# Patient Record
Sex: Male | Born: 1965
Health system: Southern US, Community
[De-identification: ages and names within clinical notes are randomized; demographics above are authoritative.]

## PROBLEM LIST (undated history)

## (undated) DIAGNOSIS — K219 Gastro-esophageal reflux disease without esophagitis: Secondary | ICD-10-CM

## (undated) DIAGNOSIS — T7840XA Allergy, unspecified, initial encounter: Secondary | ICD-10-CM

## (undated) DIAGNOSIS — H8109 Meniere's disease, unspecified ear: Secondary | ICD-10-CM

## (undated) HISTORY — PX: OTHER SURGICAL HISTORY: SHX169

## (undated) HISTORY — DX: Gastro-esophageal reflux disease without esophagitis: K21.9

## (undated) HISTORY — PX: HERNIA REPAIR: SHX51

## (undated) HISTORY — PX: KNEE SURGERY: SHX244

## (undated) HISTORY — PX: UVULECTOMY: SHX2631

## (undated) HISTORY — DX: Allergy, unspecified, initial encounter: T78.40XA

## (undated) HISTORY — DX: Meniere's disease, unspecified ear: H81.09

---

## 2004-10-13 ENCOUNTER — Encounter: Admission: RE | Admit: 2004-10-13 | Discharge: 2004-12-09 | Payer: Self-pay | Admitting: Orthopedic Surgery

## 2006-10-03 ENCOUNTER — Emergency Department (HOSPITAL_COMMUNITY): Admission: EM | Admit: 2006-10-03 | Discharge: 2006-10-03 | Payer: Self-pay | Admitting: Emergency Medicine

## 2008-10-05 ENCOUNTER — Ambulatory Visit: Payer: Self-pay | Admitting: Gastroenterology

## 2008-10-05 DIAGNOSIS — K3189 Other diseases of stomach and duodenum: Secondary | ICD-10-CM

## 2008-10-05 DIAGNOSIS — R1013 Epigastric pain: Secondary | ICD-10-CM | POA: Insufficient documentation

## 2008-10-05 DIAGNOSIS — R197 Diarrhea, unspecified: Secondary | ICD-10-CM | POA: Insufficient documentation

## 2008-10-05 LAB — CONVERTED CEMR LAB
ALT: 31 units/L (ref 0–53)
AST: 23 units/L (ref 0–37)
Albumin: 4.5 g/dL (ref 3.5–5.2)
Alkaline Phosphatase: 72 units/L (ref 39–117)
BUN: 10 mg/dL (ref 6–23)
Basophils Relative: 0.7 % (ref 0.0–3.0)
Calcium: 9.3 mg/dL (ref 8.4–10.5)
Chloride: 105 meq/L (ref 96–112)
Eosinophils Absolute: 0.3 10*3/uL (ref 0.0–0.7)
Lymphocytes Relative: 21.1 % (ref 12.0–46.0)
MCHC: 35.8 g/dL (ref 30.0–36.0)
Neutrophils Relative %: 62.1 % (ref 43.0–77.0)
Platelets: 214 10*3/uL (ref 150.0–400.0)
Potassium: 4.6 meq/L (ref 3.5–5.1)
RBC: 4.57 M/uL (ref 4.22–5.81)
Sodium: 141 meq/L (ref 135–145)
Tissue Transglutaminase Ab, IgA: 0.4 units (ref <7)
Total Protein: 7.2 g/dL (ref 6.0–8.3)
WBC: 6.4 10*3/uL (ref 4.5–10.5)

## 2009-08-09 ENCOUNTER — Encounter: Admission: RE | Admit: 2009-08-09 | Discharge: 2009-08-09 | Payer: Self-pay | Admitting: General Surgery

## 2009-10-25 ENCOUNTER — Inpatient Hospital Stay (HOSPITAL_COMMUNITY): Admission: RE | Admit: 2009-10-25 | Discharge: 2009-10-29 | Payer: Self-pay | Admitting: General Surgery

## 2009-12-13 ENCOUNTER — Encounter: Admission: RE | Admit: 2009-12-13 | Discharge: 2009-12-13 | Payer: Self-pay | Admitting: General Surgery

## 2010-03-22 ENCOUNTER — Encounter: Payer: Self-pay | Admitting: General Surgery

## 2010-05-16 LAB — DIFFERENTIAL
Basophils Absolute: 0 10*3/uL (ref 0.0–0.1)
Eosinophils Absolute: 0.1 10*3/uL (ref 0.0–0.7)
Eosinophils Relative: 2 % (ref 0–5)
Lymphocytes Relative: 27 % (ref 12–46)
Monocytes Absolute: 0.5 10*3/uL (ref 0.1–1.0)

## 2010-05-16 LAB — BASIC METABOLIC PANEL
BUN: 10 mg/dL (ref 6–23)
BUN: 10 mg/dL (ref 6–23)
CO2: 28 mEq/L (ref 19–32)
CO2: 30 mEq/L (ref 19–32)
Calcium: 8.5 mg/dL (ref 8.4–10.5)
Chloride: 103 mEq/L (ref 96–112)
Creatinine, Ser: 0.97 mg/dL (ref 0.4–1.5)
GFR calc non Af Amer: >60 mL/min (ref >60)
Glucose, Bld: 123 mg/dL — ABNORMAL HIGH (ref 70–99)
Glucose, Bld: 87 mg/dL (ref 70–99)
Potassium: 3.8 mEq/L (ref 3.5–5.1)
Potassium: 4.2 mEq/L (ref 3.5–5.1)
Sodium: 133 mEq/L — ABNORMAL LOW (ref 135–145)

## 2010-05-16 LAB — URINALYSIS, ROUTINE W REFLEX MICROSCOPIC
Glucose, UA: NEGATIVE mg/dL
Hgb urine dipstick: NEGATIVE
Ketones, ur: NEGATIVE mg/dL
Protein, ur: NEGATIVE mg/dL
Urobilinogen, UA: 1 mg/dL (ref 0.0–1.0)

## 2010-05-16 LAB — CBC
HCT: 34 % — ABNORMAL LOW (ref 39.0–52.0)
HCT: 40.7 % (ref 39.0–52.0)
HCT: 41.8 % (ref 39.0–52.0)
Hemoglobin: 11.3 g/dL — ABNORMAL LOW (ref 13.0–17.0)
Hemoglobin: 13.7 g/dL (ref 13.0–17.0)
MCH: 30.8 pg (ref 26.0–34.0)
MCH: 31.2 pg (ref 26.0–34.0)
MCH: 31.4 pg (ref 26.0–34.0)
MCHC: 33.2 g/dL (ref 30.0–36.0)
MCHC: 33.7 g/dL (ref 30.0–36.0)
MCHC: 34.4 g/dL (ref 30.0–36.0)
MCV: 90.5 fL (ref 78.0–100.0)
MCV: 92.6 fL (ref 78.0–100.0)
Platelets: 205 10*3/uL (ref 150–400)
RBC: 3.67 MIL/uL — ABNORMAL LOW (ref 4.22–5.81)
RDW: 12.4 % (ref 11.5–15.5)
RDW: 12.6 % (ref 11.5–15.5)

## 2010-05-16 LAB — SURGICAL PCR SCREEN: Staphylococcus aureus: NEGATIVE

## 2010-11-20 DIAGNOSIS — J309 Allergic rhinitis, unspecified: Secondary | ICD-10-CM | POA: Insufficient documentation

## 2010-12-15 LAB — CBC
HCT: 42.6
MCHC: 34
MCV: 91.2
Platelets: 252
RDW: 12.5
WBC: 8.5

## 2010-12-15 LAB — POCT URINALYSIS DIP (DEVICE)
Hgb urine dipstick: NEGATIVE
Ketones, ur: NEGATIVE
Specific Gravity, Urine: 1.02
Urobilinogen, UA: 1
pH: 7

## 2010-12-15 LAB — DIFFERENTIAL
Basophils Relative: 0
Eosinophils Absolute: 0
Eosinophils Relative: 1
Lymphs Abs: 1.2

## 2010-12-15 LAB — I-STAT 8, (EC8 V) (CONVERTED LAB)
Acid-Base Excess: 5 — ABNORMAL HIGH
Bicarbonate: 29.6 — ABNORMAL HIGH
Chloride: 100
pCO2, Ven: 44.4 — ABNORMAL LOW
pH, Ven: 7.432 — ABNORMAL HIGH

## 2010-12-15 LAB — POCT I-STAT CREATININE
Creatinine, Ser: 1.2
Operator id: 235561

## 2010-12-15 LAB — D-DIMER, QUANTITATIVE: D-Dimer, Quant: <0.22

## 2011-11-25 ENCOUNTER — Other Ambulatory Visit: Payer: Self-pay | Admitting: Neurology

## 2011-11-25 DIAGNOSIS — R519 Headache, unspecified: Secondary | ICD-10-CM

## 2011-11-27 ENCOUNTER — Ambulatory Visit
Admission: RE | Admit: 2011-11-27 | Discharge: 2011-11-27 | Disposition: A | Discharge status: Home / Self Care | Payer: BC Managed Care – PPO | Source: Ambulatory Visit | Attending: Neurology | Admitting: Neurology

## 2011-11-27 DIAGNOSIS — R519 Headache, unspecified: Secondary | ICD-10-CM

## 2012-07-11 ENCOUNTER — Encounter (HOSPITAL_BASED_OUTPATIENT_CLINIC_OR_DEPARTMENT_OTHER): Admission: RE | Payer: Self-pay | Source: Ambulatory Visit

## 2012-07-11 ENCOUNTER — Ambulatory Visit (HOSPITAL_BASED_OUTPATIENT_CLINIC_OR_DEPARTMENT_OTHER)
Admission: RE | Admit: 2012-07-11 | Payer: BC Managed Care – PPO | Source: Ambulatory Visit | Admitting: Orthopedic Surgery

## 2012-07-11 SURGERY — EXCISION, GANGLION CYST, WRIST
Anesthesia: General | Site: Wrist | Laterality: Right

## 2012-08-04 DIAGNOSIS — F988 Other specified behavioral and emotional disorders with onset usually occurring in childhood and adolescence: Secondary | ICD-10-CM | POA: Insufficient documentation

## 2012-09-18 ENCOUNTER — Emergency Department (HOSPITAL_COMMUNITY)
Admission: EM | Admit: 2012-09-18 | Discharge: 2012-09-18 | Disposition: A | Discharge status: Home / Self Care | Payer: BC Managed Care – PPO | Attending: Emergency Medicine | Admitting: Emergency Medicine

## 2012-09-18 DIAGNOSIS — M26609 Unspecified temporomandibular joint disorder, unspecified side: Secondary | ICD-10-CM | POA: Insufficient documentation

## 2012-09-18 DIAGNOSIS — H9190 Unspecified hearing loss, unspecified ear: Secondary | ICD-10-CM | POA: Insufficient documentation

## 2012-09-18 DIAGNOSIS — H9312 Tinnitus, left ear: Secondary | ICD-10-CM

## 2012-09-18 DIAGNOSIS — Z79899 Other long term (current) drug therapy: Secondary | ICD-10-CM | POA: Insufficient documentation

## 2012-09-18 DIAGNOSIS — H6980 Other specified disorders of Eustachian tube, unspecified ear: Secondary | ICD-10-CM | POA: Insufficient documentation

## 2012-09-18 DIAGNOSIS — H6990 Unspecified Eustachian tube disorder, unspecified ear: Secondary | ICD-10-CM | POA: Insufficient documentation

## 2012-09-18 DIAGNOSIS — H9202 Otalgia, left ear: Secondary | ICD-10-CM

## 2012-09-18 DIAGNOSIS — H9209 Otalgia, unspecified ear: Secondary | ICD-10-CM | POA: Insufficient documentation

## 2012-09-18 DIAGNOSIS — H9319 Tinnitus, unspecified ear: Secondary | ICD-10-CM | POA: Insufficient documentation

## 2012-09-18 MED ORDER — HYDROCODONE-ACETAMINOPHEN 5-325 MG PO TABS: 1.0000 | ORAL_TABLET | ORAL | Status: DC | PRN

## 2012-09-18 MED ORDER — ANTIPYRINE-BENZOCAINE 5.4-1.4 % OT SOLN
3.0000 [drp] | OTIC | Status: AC | PRN
Start: 2012-09-18 — End: 2012-09-18
  Administered 2012-09-18: 3 [drp] via OTIC
  Filled 2012-09-18: qty 10

## 2012-09-18 MED ORDER — OXYCODONE-ACETAMINOPHEN 5-325 MG PO TABS
1.0000 | ORAL_TABLET | Freq: Once | ORAL | Status: AC
  Administered 2012-09-18: 1 via ORAL
  Filled 2012-09-18: qty 1

## 2012-09-18 NOTE — ED Notes (Signed)
Pt has been having pain to his left ear for the past month.  Pt was seen by PCP and referred to ENT, pt saw ENT 2 weeks ago and was given Prednisone and "something for inflammation".  Pt states these medication did not help with the pain.  No drainage noted from ear- pt denies any other symptoms.

## 2012-09-18 NOTE — ED Provider Notes (Signed)
History  This chart was scribed for Sharilyn Sites, PA-C working with Candyce Churn, MD by Greggory Stallion, ED scribe. This patient was seen in room TR05C/TR05C and the patient's care was started at 7:29 PM.  CSN: [id number] Arrival date & time 09/18/12  1918   Chief Complaint  Patient presents with  . Otalgia   The history is provided by the patient. No language interpreter was used.    HPI Comments: Roger Martin is a 47 y.o. male who presents to the Emergency Department complaining of gradually worsening, constant left otalgia, tinnitus, that started several months ago. Pain worse with laughing, or opening his mouth wide and feels like a throbbing sensation deep in his ear.  No recent injury or trauma.  Has seen 3 different ENT physicians Annalee Genta, [place], [facility]) and was dx with sensorineural hearing loss, tinnitus, eustachian tube dysfunction, and TMJ by all 3.  He has been taking prednisone and ibuprofen given to him by ENT without relief.  Pt has also seen dentist for TMJ and is being fitting for a mouth guard.  No recent ear infections, fevers, sweats, or chills.  Pt has an appt with new ENT tomorrow.  No difficulty swallowing.  Pt brought in medical records from ENT which i have reviewed.  CT of sinuses completed Sept 2013 which was normal.   No past medical history on file. No past surgical history on file. No family history on file. History  Substance Use Topics  . Smoking status: Not on file  . Smokeless tobacco: Not on file  . Alcohol Use: Not on file    Review of Systems  HENT: Positive for hearing loss, ear pain and tinnitus.   All other systems reviewed and are negative.    Allergies  Review of patient's allergies indicates not on file.  Home Medications  No current outpatient prescriptions on file.  BP 136/97  Pulse 91  Temp(Src) 97.8 F (36.6 C)  Resp 17  Ht 5\' 9"  (1.753 m)  Wt 196 lb (88.905 kg)  BMI 28.93 kg/m2  SpO2 96%  Physical Exam   Nursing note and vitals reviewed. Constitutional: He is oriented to person, place, and time. He appears well-developed and well-nourished.  HENT:  Head: Normocephalic and atraumatic.  Left Ear: Tympanic membrane, external ear and ear canal normal. No drainage, swelling or tenderness. No foreign bodies. No mastoid tenderness. Tympanic membrane is not injected, not scarred, not erythematous and not bulging.  No middle ear effusion. No hemotympanum. Decreased hearing (muffled sounds) is noted.  Mouth/Throat: Oropharynx is clear and moist.  Decreased hearing of left ear (muffled sounds), TM normal, no mastoid tenderness, no middle ear effusion or evidence of OM/OE  Eyes: Conjunctivae and EOM are normal. Pupils are equal, round, and reactive to light.  Neck: Normal range of motion. Neck supple.  Cardiovascular: Normal rate, regular rhythm and normal heart sounds.   Pulmonary/Chest: Effort normal and breath sounds normal.  Musculoskeletal: Normal range of motion.  Neurological: He is alert and oriented to person, place, and time.  Skin: Skin is warm and dry.  Psychiatric: He has a normal mood and affect.    ED Course  Procedures (including critical care time)  DIAGNOSTIC STUDIES: Oxygen Saturation is 96% on RA, normal by my interpretation.    COORDINATION OF CARE: 7:49 PM-Discussed treatment plan which includes pain medication with pt at bedside and pt agreed to plan.   Labs Reviewed - No data to display No results found.  1.  Otalgia of left ear   2. Tinnitus, left     MDM   Chronic ear problems, currently managed by ENT.  Auralgan and percocet given in the ED.  Do not feel that CT is indicated at this time as sx have not changed, there is no mastoid tenderness, or evidence of indwelling infection.  Rx vicodin.  FU with ENT tomorrow as previously scheduled.  Discussed plan with pt, he agreed.  Return precautions advised.  I personally performed the services described in this  documentation, which was scribed in my presence. The recorded information has been reviewed and is accurate.   Garlon Hatchet, PA-C 09/18/12 2037

## 2012-09-21 NOTE — ED Provider Notes (Signed)
Medical screening examination/treatment/procedure(s) were performed by non-physician practitioner and as supervising physician I was immediately available for consultation/collaboration.  Candyce Churn, MD 09/21/12 1101

## 2013-03-29 DIAGNOSIS — H8102 Meniere's disease, left ear: Secondary | ICD-10-CM | POA: Insufficient documentation

## 2014-10-30 DIAGNOSIS — H918X2 Other specified hearing loss, left ear: Secondary | ICD-10-CM | POA: Insufficient documentation

## 2015-05-28 DIAGNOSIS — E038 Other specified hypothyroidism: Secondary | ICD-10-CM | POA: Insufficient documentation

## 2016-03-12 DIAGNOSIS — K645 Perianal venous thrombosis: Secondary | ICD-10-CM | POA: Insufficient documentation

## 2016-10-16 DIAGNOSIS — J301 Allergic rhinitis due to pollen: Secondary | ICD-10-CM | POA: Diagnosis not present

## 2016-10-19 DIAGNOSIS — J3089 Other allergic rhinitis: Secondary | ICD-10-CM | POA: Diagnosis not present

## 2017-06-07 DIAGNOSIS — G510 Bell's palsy: Secondary | ICD-10-CM | POA: Diagnosis not present

## 2017-06-07 DIAGNOSIS — K119 Disease of salivary gland, unspecified: Secondary | ICD-10-CM | POA: Diagnosis not present

## 2017-06-07 DIAGNOSIS — R221 Localized swelling, mass and lump, neck: Secondary | ICD-10-CM | POA: Diagnosis not present

## 2017-06-07 DIAGNOSIS — R200 Anesthesia of skin: Secondary | ICD-10-CM | POA: Diagnosis not present

## 2017-06-10 DIAGNOSIS — K118 Other diseases of salivary glands: Secondary | ICD-10-CM | POA: Insufficient documentation

## 2017-06-10 DIAGNOSIS — G510 Bell's palsy: Secondary | ICD-10-CM | POA: Diagnosis not present

## 2017-06-25 DIAGNOSIS — J301 Allergic rhinitis due to pollen: Secondary | ICD-10-CM | POA: Diagnosis not present

## 2017-06-28 DIAGNOSIS — J3089 Other allergic rhinitis: Secondary | ICD-10-CM | POA: Diagnosis not present

## 2017-09-22 DIAGNOSIS — B9789 Other viral agents as the cause of diseases classified elsewhere: Secondary | ICD-10-CM | POA: Diagnosis not present

## 2017-09-22 DIAGNOSIS — J069 Acute upper respiratory infection, unspecified: Secondary | ICD-10-CM | POA: Diagnosis not present

## 2017-10-04 DIAGNOSIS — N529 Male erectile dysfunction, unspecified: Secondary | ICD-10-CM | POA: Diagnosis not present

## 2017-10-04 DIAGNOSIS — N50819 Testicular pain, unspecified: Secondary | ICD-10-CM | POA: Diagnosis not present

## 2018-03-17 DIAGNOSIS — J3081 Allergic rhinitis due to animal (cat) (dog) hair and dander: Secondary | ICD-10-CM | POA: Diagnosis not present

## 2018-03-17 DIAGNOSIS — H1045 Other chronic allergic conjunctivitis: Secondary | ICD-10-CM | POA: Diagnosis not present

## 2018-03-17 DIAGNOSIS — J301 Allergic rhinitis due to pollen: Secondary | ICD-10-CM | POA: Diagnosis not present

## 2018-03-17 DIAGNOSIS — J3089 Other allergic rhinitis: Secondary | ICD-10-CM | POA: Diagnosis not present

## 2018-03-30 DIAGNOSIS — J301 Allergic rhinitis due to pollen: Secondary | ICD-10-CM | POA: Diagnosis not present

## 2018-03-31 DIAGNOSIS — J3089 Other allergic rhinitis: Secondary | ICD-10-CM | POA: Diagnosis not present

## 2018-06-20 ENCOUNTER — Ambulatory Visit: Payer: Self-pay | Admitting: Adult Health

## 2018-08-01 DIAGNOSIS — J3089 Other allergic rhinitis: Secondary | ICD-10-CM | POA: Diagnosis not present

## 2018-08-01 DIAGNOSIS — J301 Allergic rhinitis due to pollen: Secondary | ICD-10-CM | POA: Diagnosis not present

## 2018-08-08 ENCOUNTER — Ambulatory Visit: Payer: BC Managed Care – PPO | Admitting: Adult Health

## 2018-08-08 NOTE — Progress Notes (Deleted)
   Subjective:    Patient ID: Roger Martin, male    DOB: 07/04/1965, 53 y.o.   MRN: [id number]  HPI:  Mr. Zaino is here to establish as a new pt.  He is a pleasant 53 year old male. PMH: Depression   Patient Care Team    Relationship Specialty Notifications Start End  Vickii Penna, MD PCP - General Family Medicine  07/04/12     Patient Active Problem List   Diagnosis Date Noted  . DYSPEPSIA 10/05/2008  . DIARRHEA 10/05/2008     No past medical history on file.   *** The histories are not reviewed yet. Please review them in the "History" navigator section and refresh this [place].   No family history on file.   Social History   Substance and Sexual Activity  Drug Use Not on file     Social History   Substance and Sexual Activity  Alcohol Use Not on file     Social History   Tobacco Use  Smoking Status Not on file     Outpatient Encounter Medications as of 08/08/2018  Medication Sig  . Ascorbic Acid (VITAMIN C PO) Take 1 tablet by mouth daily.  Marland Kitchen atomoxetine (STRATTERA) 10 MG capsule Take 10 mg by mouth daily.  Marland Kitchen etodolac (LODINE) 400 MG tablet Take 400 mg by mouth 2 (two) times daily.  . fexofenadine (ALLEGRA) 180 MG tablet Take 180 mg by mouth daily.  Marland Kitchen HYDROcodone-acetaminophen (NORCO/VICODIN) 5-325 MG per tablet Take 1 tablet by mouth every 4 (four) hours as needed for pain.  Marland Kitchen ibuprofen (ADVIL,MOTRIN) 200 MG tablet Take 800 mg by mouth every 6 (six) hours as needed for pain or headache.  . montelukast (SINGULAIR) 10 MG tablet Take 10 mg by mouth daily.  . Multiple Vitamins-Minerals (ZINC PO) Take 1 tablet by mouth daily.  . Oxymetazoline HCl (NASAL SPRAY NA) Place 1 spray into the nose daily.  . predniSONE (DELTASONE) 20 MG tablet Take 20 mg by mouth daily.   No facility-administered encounter medications on file as of 08/08/2018.     Allergies: Patient has no allergy information on record.  There is no height or weight on file to calculate  BMI.  There were no vitals taken for this visit.     Review of Systems     Objective:   Physical Exam        Assessment & Plan:  No diagnosis found.  No problem-specific Assessment & Plan notes found for this encounter.    FOLLOW-UP:  No follow-ups on file.

## 2018-08-10 DIAGNOSIS — H00022 Hordeolum internum right lower eyelid: Secondary | ICD-10-CM | POA: Diagnosis not present

## 2018-08-10 DIAGNOSIS — H11043 Peripheral pterygium, stationary, bilateral: Secondary | ICD-10-CM | POA: Diagnosis not present

## 2018-08-11 ENCOUNTER — Ambulatory Visit: Payer: BC Managed Care – PPO | Admitting: Adult Health

## 2018-08-11 ENCOUNTER — Encounter: Payer: Self-pay | Admitting: Adult Health

## 2018-08-11 ENCOUNTER — Other Ambulatory Visit: Payer: Self-pay

## 2018-08-11 VITALS — BP 120/76 | HR 60 | Temp 98.2°F | Ht 68.25 in | Wt 195.7 lb

## 2018-08-11 DIAGNOSIS — Z1211 Encounter for screening for malignant neoplasm of colon: Secondary | ICD-10-CM

## 2018-08-11 DIAGNOSIS — Z0000 Encounter for general adult medical examination without abnormal findings: Secondary | ICD-10-CM | POA: Insufficient documentation

## 2018-08-11 DIAGNOSIS — S36119A Unspecified injury of liver, initial encounter: Secondary | ICD-10-CM | POA: Insufficient documentation

## 2018-08-11 DIAGNOSIS — M5432 Sciatica, left side: Secondary | ICD-10-CM

## 2018-08-11 DIAGNOSIS — H00022 Hordeolum internum right lower eyelid: Secondary | ICD-10-CM | POA: Diagnosis not present

## 2018-08-11 DIAGNOSIS — S36119S Unspecified injury of liver, sequela: Secondary | ICD-10-CM

## 2018-08-11 MED ORDER — OMEPRAZOLE 20 MG PO CPDR: 20.0000 mg | DELAYED_RELEASE_CAPSULE | Freq: Every day | ORAL | 2 refills | Status: DC

## 2018-08-11 NOTE — Assessment & Plan Note (Signed)
Omeprazole 20mg QD 
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
## 2018-08-11 NOTE — Patient Instructions (Signed)
Heartburn Heartburn is a type of pain or discomfort that can happen in the throat or chest. It is often described as a burning pain. It may also cause a bad, acid-like taste in the mouth. Heartburn may feel worse when you lie down or bend over. It may be worse at night. It may be caused by stomach contents that move back up (reflux) into the tube that connects the mouth with the stomach (esophagus). Follow these instructions at home: Eating and drinking   Avoid certain foods and drinks as told by your doctor. This may include: ? Coffee and tea (with or without caffeine). ? Drinks that have alcohol. ? Energy drinks and sports drinks. ? Carbonated drinks or sodas. ? Chocolate and cocoa. ? Peppermint and mint flavorings. ? Garlic and onions. ? Horseradish. ? Spicy and acidic foods, such as:  Peppers.  Chili powder and curry powder.  Vinegar.  Hot sauces and BBQ sauce. ? Citrus fruit juices and citrus fruits, such as:  Oranges.  Lemons.  Limes. ? Tomato-based foods, such as:  Red sauce and pizza with red sauce.  Chili.  Salsa. ? Fried and fatty foods, such as:  Donuts.  French fries and potato chips.  High-fat dressings. ? High-fat meats, such as:  Hot dogs and sausage.  Rib eye steak.  Ham and bacon. ? High-fat dairy items, such as:  Whole milk.  Butter.  Cream cheese.  Eat small meals often. Avoid eating large meals.  Avoid drinking large amounts of liquid with your meals.  Avoid eating meals during the 2-3 hours before bedtime.  Avoid lying down right after you eat.  Do not exercise right after you eat. Lifestyle      If you are overweight, lose an amount of weight that is healthy for you. Ask your doctor about a safe weight loss goal.  Do not use any products that contain nicotine or tobacco, including cigarettes, e-cigarettes, and chewing tobacco. These can make your symptoms worse. If you need help quitting, ask your doctor.  Wear loose  clothes. Do not wear anything tight around your waist.  Raise (elevate) the head of your bed about 6 inches (15 cm) when you sleep.  Try to lower your stress. If you need help doing this, ask your doctor. General instructions  Pay attention to any changes in your symptoms.  Take over-the-counter and prescription medicines only as told by your doctor. ? Do not take aspirin, ibuprofen, or other NSAIDs unless your doctor says it is okay. ? Stop medicines only as told by your doctor.  Keep all follow-up visits as told by your doctor. This is important. Contact a doctor if:  You have new symptoms.  You lose weight and you do not know why it is happening.  You have trouble swallowing, or it hurts to swallow.  You have wheezing or a cough that keeps happening.  Your symptoms do not get better with treatment.  You have heartburn often for more than 2 weeks. Get help right away if:  You have pain in your arms, neck, jaw, teeth, or back.  You feel sweaty, dizzy, or light-headed.  You have chest pain or shortness of breath.  You throw up (vomit) and your throw up looks like blood or coffee grounds.  Your poop (stool) is bloody or black. These symptoms may represent a serious problem that is an emergency. Do not wait to see if the symptoms will go away. Get medical help right away. Call your local   emergency services (911 in the [place] Do not drive yourself to the hospital. Summary  Heartburn is a type of pain that can happen in the throat or chest. It can feel like a burning pain. It may also cause a bad, acid-like taste in the mouth.  You may need to avoid certain foods and drinks to help your symptoms. Ask your doctor what foods and drinks you should avoid.  Take over-the-counter and prescription medicines only as told by your doctor. Do not take aspirin, ibuprofen, or other NSAIDs unless your doctor told you to do so.  Contact your doctor if your symptoms do not get better or  they get worse. This information is not intended to replace advice given to you by your health care provider. Make sure you discuss any questions you have with your health care provider. Document Released: 10/29/2010 Document Revised: 07/19/2017 Document Reviewed: 07/19/2017 Elsevier Interactive Patient Education  2019 [employer] well hydrated, follow Heart Healthy Diet. Continue your excellent exercise program. Referral to Physical Therapy placed, re: Left sided sciatica. Omeprazole prescription sent to [facility] on [address]. Please schedule complete physical this summer. Continue to social distance and wear a mask when out in public. WELCOME TO THE PRACTICE!

## 2018-08-11 NOTE — Assessment & Plan Note (Signed)
Remain well hydrated, follow Heart Healthy Diet. Continue your excellent exercise program. Referral to Physical Therapy placed, re: Left sided sciatica. Omeprazole prescription sent to [facility] on [address]. Please schedule complete physical this summer. Continue to social distance and wear a mask when out in public.

## 2018-08-12 DIAGNOSIS — M5432 Sciatica, left side: Secondary | ICD-10-CM | POA: Insufficient documentation

## 2018-08-12 NOTE — Assessment & Plan Note (Signed)
>  20 years His normal stretching routine has not relieved the pain, which it typically does PT referral placed.
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
## 2018-08-22 ENCOUNTER — Other Ambulatory Visit: Payer: Self-pay

## 2018-08-22 ENCOUNTER — Ambulatory Visit: Payer: BC Managed Care – PPO | Attending: Adult Health | Admitting: Physical Therapy

## 2018-08-22 DIAGNOSIS — G8929 Other chronic pain: Secondary | ICD-10-CM | POA: Insufficient documentation

## 2018-08-22 DIAGNOSIS — M5442 Lumbago with sciatica, left side: Secondary | ICD-10-CM | POA: Insufficient documentation
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
## 2018-08-22 NOTE — Patient Instructions (Signed)
Access Code: [email or web address]  URL: [email or web address]  Date: 08/22/2018  Prepared by: Elsie Ra   Exercises  Lying Prone - 1 reps - 1 sets - 3 to 5 min hold - 2x daily - 6x weekly  Supine Piriformis Stretch - 3 reps - 1 sets - 30 hold - 2x daily - 6x weekly  Supine Hamstring Stretch with Strap - 3 reps - 1 sets - 30 hold - 2x daily - 6x weekly  Supine Bridge - 10 reps - 1-2 sets - 5 hold - 2x daily - 6x weekly  Bird Dog - 10 reps - 1 sets - 5 sec hold - 2x daily - 6x weekly  Standing Quadratus Lumborum Stretch with Doorway - 3 reps - 1 sets - 30 hold - 2x daily - 6x weekly

## 2018-08-30 ENCOUNTER — Ambulatory Visit: Payer: BC Managed Care – PPO | Admitting: Physical Therapy

## 2018-09-05 ENCOUNTER — Encounter: Payer: Self-pay | Admitting: Physical Therapy

## 2018-09-05 ENCOUNTER — Ambulatory Visit: Payer: BC Managed Care – PPO | Attending: Adult Health | Admitting: Physical Therapy

## 2018-09-05 ENCOUNTER — Other Ambulatory Visit: Payer: Self-pay

## 2018-09-05 DIAGNOSIS — G8929 Other chronic pain: Secondary | ICD-10-CM

## 2018-09-05 DIAGNOSIS — M5442 Lumbago with sciatica, left side: Secondary | ICD-10-CM | POA: Diagnosis not present
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
## 2018-09-06 ENCOUNTER — Encounter: Payer: Self-pay | Admitting: Gastroenterology

## 2018-09-12 ENCOUNTER — Ambulatory Visit: Payer: BC Managed Care – PPO | Admitting: Physical Therapy

## 2018-09-19 ENCOUNTER — Ambulatory Visit: Payer: BC Managed Care – PPO | Admitting: Physical Therapy

## 2018-09-23 ENCOUNTER — Other Ambulatory Visit: Payer: Self-pay

## 2018-09-23 ENCOUNTER — Ambulatory Visit (AMBULATORY_SURGERY_CENTER): Payer: Self-pay

## 2018-09-23 VITALS — Ht 69.0 in | Wt 194.0 lb

## 2018-09-23 DIAGNOSIS — Z1211 Encounter for screening for malignant neoplasm of colon: Secondary | ICD-10-CM

## 2018-09-23 MED ORDER — PEG 3350-KCL-NA BICARB-NACL 420 G PO SOLR: 4000.0000 mL | Freq: Once | ORAL | 0 refills | Status: AC

## 2018-09-23 NOTE — Progress Notes (Signed)
Denies allergies to eggs or soy products. Denies complication of anesthesia or sedation. Denies use of weight loss medication. Denies use of O2.   Emmi instructions given for colonoscopy.   Pre-Visit was conducted by phone due to Covid 19. Instructions were reviewed and mailed to patients confirmed home address. Patient was encouraged to call if they had any questions regarding instructions.

## 2018-09-27 ENCOUNTER — Ambulatory Visit: Payer: BC Managed Care – PPO | Admitting: Physical Therapy

## 2018-09-27 ENCOUNTER — Telehealth: Payer: Self-pay | Admitting: Physical Therapy

## 2018-09-27 NOTE — Telephone Encounter (Signed)
Pt no show for PT appointment today. They where contacted and informed of this via voicemail. They where given number to call front office to reschedule as he does not have any more appointments after today.  Elsie Ra, PT, DPT 09/27/18 5:22 PM

## 2018-10-06 ENCOUNTER — Telehealth: Payer: Self-pay | Admitting: Gastroenterology

## 2018-10-06 NOTE — Telephone Encounter (Signed)

## 2018-10-06 NOTE — Telephone Encounter (Signed)
Pt responded "no" to all screening questions °

## 2018-10-07 ENCOUNTER — Encounter: Payer: Self-pay | Admitting: Gastroenterology

## 2018-10-07 ENCOUNTER — Ambulatory Visit (AMBULATORY_SURGERY_CENTER): Payer: BC Managed Care – PPO | Admitting: Gastroenterology

## 2018-10-07 ENCOUNTER — Other Ambulatory Visit: Payer: Self-pay

## 2018-10-07 VITALS — BP 117/80 | HR 65 | Temp 97.7°F | Resp 15 | Ht 68.25 in | Wt 195.0 lb

## 2018-10-07 DIAGNOSIS — Z1211 Encounter for screening for malignant neoplasm of colon: Secondary | ICD-10-CM | POA: Diagnosis not present

## 2018-10-07 MED ORDER — SODIUM CHLORIDE 0.9 % IV SOLN: 500.0000 mL | Freq: Once | INTRAVENOUS | Status: DC

## 2018-10-07 NOTE — Op Note (Signed)
[place] Patient Name: Roger Martin Procedure Date: 10/07/2018 7:58 AM MRN: [id number] Endoscopist: Milus Banister , MD Age: 53 Referring MD:  Date of Birth: 1965-10-10 Gender: Male Account #: [id number] Procedure:                Colonoscopy Indications:              Screening for colorectal malignant neoplasm Medicines:                Monitored Anesthesia Care Procedure:                Pre-Anesthesia Assessment:                           - Prior to the procedure, a History and Physical                            was performed, and patient medications and                            allergies were reviewed. The patient's tolerance of                            previous anesthesia was also reviewed. The risks                            and benefits of the procedure and the sedation                            options and risks were discussed with the patient.                            All questions were answered, and informed consent                            was obtained. Prior Anticoagulants: The patient has                            taken no previous anticoagulant or antiplatelet                            agents. ASA Grade Assessment: II - A patient with                            mild systemic disease. After reviewing the risks                            and benefits, the patient was deemed in                            satisfactory condition to undergo the procedure.                           After obtaining informed consent, the colonoscope  was passed under direct vision. Throughout the                            procedure, the patient's blood pressure, pulse, and                            oxygen saturations were monitored continuously. The                            Colonoscope was introduced through the anus and                            advanced to the the cecum, identified by                            appendiceal orifice and  ileocecal valve. The                            colonoscopy was performed without difficulty. The                            patient tolerated the procedure well. The quality                            of the bowel preparation was good. The ileocecal                            valve, appendiceal orifice, and rectum were                            photographed. Scope In: 8:04:06 AM Scope Out: 8:16:32 AM Scope Withdrawal Time: 0 hours 10 minutes 15 seconds  Total Procedure Duration: 0 hours 12 minutes 26 seconds  Findings:                 The entire examined colon appeared normal on direct                            and retroflexion views. Complications:            No immediate complications. Estimated blood loss:                            None. Estimated Blood Loss:     Estimated blood loss: none. Impression:               - The entire examined colon is normal on direct and                            retroflexion views.                           - No polyps or cancers. Recommendation:           - Patient has a contact number available for  emergencies. The signs and symptoms of potential                            delayed complications were discussed with the                            patient. Return to normal activities tomorrow.                            Written discharge instructions were provided to the                            patient.                           - Resume previous diet.                           - Continue present medications.                           - Repeat colonoscopy in 10 years for screening. Rachael Feeaniel P Jacobs, MD 10/07/2018 8:20:50 AM This report has been signed electronically.

## 2018-10-07 NOTE — Patient Instructions (Signed)
YOU HAD AN ENDOSCOPIC PROCEDURE TODAY AT THE [facility]:   Refer to the procedure report that was given to you for any specific questions about what was found during the examination.  If the procedure report does not answer your questions, please call your gastroenterologist to clarify.  If you requested that your care partner not be given the details of your procedure findings, then the procedure report has been included in a sealed envelope for you to review at your convenience later.  YOU SHOULD EXPECT: Some feelings of bloating in the abdomen. Passage of more gas than usual.  Walking can help get rid of the air that was put into your GI tract during the procedure and reduce the bloating. If you had a lower endoscopy (such as a colonoscopy or flexible sigmoidoscopy) you may notice spotting of blood in your stool or on the toilet paper. If you underwent a bowel prep for your procedure, you may not have a normal bowel movement for a few days.  Please Note:  You might notice some irritation and congestion in your nose or some drainage.  This is from the oxygen used during your procedure.  There is no need for concern and it should clear up in a day or so.  SYMPTOMS TO REPORT IMMEDIATELY:   Following lower endoscopy (colonoscopy or flexible sigmoidoscopy):  Excessive amounts of blood in the stool  Significant tenderness or worsening of abdominal pains  Swelling of the abdomen that is new, acute  Fever of 100F or higher   Following upper endoscopy (EGD)  Vomiting of blood or coffee ground material  New chest pain or pain under the shoulder blades  Painful or persistently difficult swallowing  New shortness of breath  Fever of 100F or higher  Black, tarry-looking stools  For urgent or emergent issues, a gastroenterologist can be reached at any hour by calling [phone].   DIET:  We do recommend a small meal at first, but then you may proceed to your regular diet.  Drink  plenty of fluids but you should avoid alcoholic beverages for 24 hours.  ACTIVITY:  You should plan to take it easy for the rest of today and you should NOT DRIVE or use heavy machinery until tomorrow (because of the sedation medicines used during the test).    FOLLOW UP: Our staff will call the number listed on your records 48-72 hours following your procedure to check on you and address any questions or concerns that you may have regarding the information given to you following your procedure. If we do not reach you, we will leave a message.  We will attempt to reach you two times.  During this call, we will ask if you have developed any symptoms of COVID 19. If you develop any symptoms (ie: fever, flu-like symptoms, shortness of breath, cough etc.) before then, please call [phone].  If you test positive for Covid 19 in the 2 weeks post procedure, please call and report this information to us.    If any biopsies were taken you will be contacted by phone or by letter within the next 1-3 weeks.  Please call us at [phone] if you have not heard about the biopsies in 3 weeks.    SIGNATURES/CONFIDENTIALITY: You and/or your care partner have signed paperwork which will be entered into your electronic medical record.  These signatures attest to the fact that that the information above on your After Visit Summary has been reviewed and is   understood.  Full responsibility of the confidentiality of this discharge information lies with you and/or your care-partner. 

## 2018-10-07 NOTE — Progress Notes (Signed)
Report to PACU, RN, vss, BBS= Clear.  

## 2018-10-07 NOTE — Progress Notes (Signed)
Pt's states no medical or surgical changes since previsit or office visit.  [facility] [place] 

## 2018-10-11 ENCOUNTER — Telehealth: Payer: Self-pay

## 2018-10-11 NOTE — Telephone Encounter (Signed)
First post procedure follow up call, no answer 

## 2018-11-08 DIAGNOSIS — N529 Male erectile dysfunction, unspecified: Secondary | ICD-10-CM | POA: Diagnosis not present

## 2018-11-22 ENCOUNTER — Other Ambulatory Visit: Payer: BC Managed Care – PPO

## 2018-11-22 ENCOUNTER — Other Ambulatory Visit: Payer: Self-pay

## 2018-11-22 DIAGNOSIS — Z0000 Encounter for general adult medical examination without abnormal findings: Secondary | ICD-10-CM

## 2018-11-22 DIAGNOSIS — R7989 Other specified abnormal findings of blood chemistry: Secondary | ICD-10-CM | POA: Diagnosis not present

## 2018-11-22 DIAGNOSIS — E039 Hypothyroidism, unspecified: Secondary | ICD-10-CM | POA: Diagnosis not present

## 2018-11-23 LAB — CBC WITH DIFFERENTIAL/PLATELET
Basophils Absolute: 0 10*3/uL (ref 0.0–0.2)
Basos: 1 %
EOS (ABSOLUTE): 0.2 10*3/uL (ref 0.0–0.4)
Eos: 4 %
Hematocrit: 42.2 % (ref 37.5–51.0)
Hemoglobin: 14.7 g/dL (ref 13.0–17.7)
Immature Grans (Abs): 0 10*3/uL (ref 0.0–0.1)
Immature Granulocytes: 1 %
Lymphocytes Absolute: 1 10*3/uL (ref 0.7–3.1)
Lymphs: 20 %
MCH: 31.7 pg (ref 26.6–33.0)
MCHC: 34.8 g/dL (ref 31.5–35.7)
MCV: 91 fL (ref 79–97)
Monocytes Absolute: 0.6 10*3/uL (ref 0.1–0.9)
Monocytes: 11 %
Neutrophils Absolute: 3.5 10*3/uL (ref 1.4–7.0)
Neutrophils: 63 %
Platelets: 247 10*3/uL (ref 150–450)
RBC: 4.64 x10E6/uL (ref 4.14–5.80)
RDW: 12.5 % (ref 11.6–15.4)
WBC: 5.3 10*3/uL (ref 3.4–10.8)

## 2018-11-23 LAB — HEMOGLOBIN A1C
Est. average glucose Bld gHb Est-mCnc: 100 mg/dL
Hgb A1c MFr Bld: 5.1 % (ref 4.8–5.6)

## 2018-11-23 LAB — COMPREHENSIVE METABOLIC PANEL
ALT: 18 IU/L (ref 0–44)
AST: 19 IU/L (ref 0–40)
Albumin/Globulin Ratio: 2.1 (ref 1.2–2.2)
Albumin: 4.7 g/dL (ref 3.8–4.9)
Alkaline Phosphatase: 93 IU/L (ref 39–117)
BUN/Creatinine Ratio: 12 (ref 9–20)
BUN: 15 mg/dL (ref 6–24)
Bilirubin Total: 0.7 mg/dL (ref 0.0–1.2)
CO2: 27 mmol/L (ref 20–29)
Calcium: 9.4 mg/dL (ref 8.7–10.2)
Chloride: 100 mmol/L (ref 96–106)
Creatinine, Ser: 1.22 mg/dL (ref 0.76–1.27)
GFR calc Af Amer: 78 mL/min/{1.73_m2} (ref >59)
GFR calc non Af Amer: 68 mL/min/{1.73_m2} (ref >59)
Globulin, Total: 2.2 g/dL (ref 1.5–4.5)
Glucose: 95 mg/dL (ref 65–99)
Potassium: 4.6 mmol/L (ref 3.5–5.2)
Sodium: 137 mmol/L (ref 134–144)
Total Protein: 6.9 g/dL (ref 6.0–8.5)

## 2018-11-23 LAB — LIPID PANEL
Chol/HDL Ratio: 3.3 ratio (ref 0.0–5.0)
Cholesterol, Total: 199 mg/dL (ref 100–199)
HDL: 60 mg/dL (ref >39)
LDL Chol Calc (NIH): 112 mg/dL — ABNORMAL HIGH (ref 0–99)
Triglycerides: 157 mg/dL — ABNORMAL HIGH (ref 0–149)
VLDL Cholesterol Cal: 27 mg/dL (ref 5–40)

## 2018-11-23 LAB — TSH: TSH: 4.61 u[IU]/mL — ABNORMAL HIGH (ref 0.450–4.500)

## 2018-11-28 ENCOUNTER — Other Ambulatory Visit: Payer: Self-pay

## 2018-11-28 ENCOUNTER — Encounter: Payer: Self-pay | Admitting: Adult Health

## 2018-11-28 ENCOUNTER — Ambulatory Visit (INDEPENDENT_AMBULATORY_CARE_PROVIDER_SITE_OTHER): Payer: BC Managed Care – PPO | Admitting: Adult Health

## 2018-11-28 VITALS — BP 120/74 | HR 76 | Temp 98.4°F | Ht 68.25 in | Wt 198.3 lb

## 2018-11-28 DIAGNOSIS — Z0000 Encounter for general adult medical examination without abnormal findings: Secondary | ICD-10-CM

## 2018-11-28 DIAGNOSIS — Z87898 Personal history of other specified conditions: Secondary | ICD-10-CM | POA: Insufficient documentation

## 2018-11-28 DIAGNOSIS — Z23 Encounter for immunization: Secondary | ICD-10-CM | POA: Diagnosis not present
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
## 2018-11-28 NOTE — Patient Instructions (Addendum)

## 2018-11-30 LAB — T4, FREE: Free T4: 1.32 ng/dL (ref 0.82–1.77)

## 2018-11-30 LAB — T3: T3, Total: 110 ng/dL (ref 71–180)

## 2018-11-30 LAB — SPECIMEN STATUS REPORT

## 2018-12-12 ENCOUNTER — Ambulatory Visit: Payer: BC Managed Care – PPO | Admitting: Adult Health

## 2018-12-12 ENCOUNTER — Encounter: Payer: Self-pay | Admitting: Adult Health

## 2018-12-12 ENCOUNTER — Other Ambulatory Visit: Payer: Self-pay

## 2018-12-12 DIAGNOSIS — M26622 Arthralgia of left temporomandibular joint: Secondary | ICD-10-CM | POA: Diagnosis not present

## 2018-12-12 DIAGNOSIS — M26629 Arthralgia of temporomandibular joint, unspecified side: Secondary | ICD-10-CM | POA: Insufficient documentation

## 2018-12-12 DIAGNOSIS — Z87898 Personal history of other specified conditions: Secondary | ICD-10-CM | POA: Diagnosis not present

## 2018-12-12 MED ORDER — PROMETHAZINE HCL 25 MG PO TABS: 25.0000 mg | ORAL_TABLET | Freq: Three times a day (TID) | ORAL | 0 refills | Status: DC | PRN

## 2018-12-12 NOTE — Patient Instructions (Addendum)
Meniere Disease  Meniere disease is an inner ear disorder. It causes attacks of a spinning sensation (vertigo), dizziness, and ringing in the ear (tinnitus). It also causes hearing loss and a feeling of fullness or pressure in the ear. This is a lifelong condition, and it may get worse over time. You may have drop attacks or severe dizziness that makes you fall. A drop attack is when you suddenly fall without losing consciousness and you quickly recover after a few seconds or minutes. What are the causes? This condition is caused by having too much of the fluid that is in your inner ear (endolymph). When fluid builds up in your inner ear, it affects the nerves that control balance and hearing. The reason for the fluid buildup is not known. Possible causes include:  Allergies.  An abnormal reaction of the body's defense system (autoimmune disease).  Viral infection of the inner ear.  Head injury. What increases the risk? You are more likely to develop this condition if:  You are older than age 40.  You have a family history of Meniere disease.  You have a history of autoimmune disease.  You have a history of migraine headaches. What are the signs or symptoms? Symptoms of this condition can come and go and may last for up to 4 hours at a time. Symptoms usually start in one ear. They may become more frequent and eventually involve both ears. Symptoms can include:  Fullness and pressure in your ear.  Roaring or ringing in your ear.  Vertigo and loss of balance.  Dizziness.  Decreased hearing.  Nausea and vomiting. How is this diagnosed? This condition is diagnosed based on:  A physical exam.  Tests , such as: ? A hearing test (audiogram). ? An electronystagmogram. This tests your balance nerve (vestibular nerve). ? Imaging studies of your inner ear, such as CT scan or MRI. ? Other balance tests, such as rotational or balance platform tests. How is this treated? There is  no cure for this condition, but treatment can help to manage your symptoms. Treatment may include:  A low-salt diet. Limiting salt may help to reduce fluid in the body and relieve symptoms.  Oral or injected medicines to reduce or control: ? Vertigo. ? Nausea. ? Fluid retention. ? Dizziness.  Use of an air pressure pulse generator. This is a machine that sends small pressure pulses into your ear canal.  Hearing aids.  Inner ear surgery. This is rare. When you have symptoms, it can be helpful to lie down on a flat surface and focus your eyes on one object that does not move. Try to stay in that position until your symptoms go away. Follow these instructions at home: Eating and drinking  Eat the same amount of food at the same time every day, including snacks.  Do not skip meals.  Avoid caffeine.  Drink enough fluids to keep your urine clear or pale yellow.  Limit alcoholic drinks to one drink a day for non-pregnant women and 2 drinks a day for men. One drink equals 12 oz of beer, 5 oz of wine, or 1 oz of hard liquor.  Limit the salt (sodium) in your diet as told by your health care provider. Check ingredients and nutrition facts on packaged foods and beverages.  Do not eat foods that contain monosodium glutamate (MSG). General instructions  Do not use any products that contain nicotine or tobacco, such as cigarettes and e-cigarettes. If you need help quitting, ask your   health care provider.  Take over-the-counter and prescription medicines only as told by your health care provider.  Find ways to reduce or avoid stress. If you need help with this, ask your health care provider.  Do not drive if you have vertigo or dizziness. Contact a health care provider if:  You have symptoms that last longer than 4 hours.  You have new or worse symptoms. Get help right away if:  You have been vomiting for 24 hours.  You cannot keep fluids down.  You have chest pain or trouble  breathing. Summary  Meniere disease is an inner ear disorder. It causes attacks of a spinning sensation (vertigo), dizziness, and ringing in the ear (tinnitus). It also causes hearing loss and a feeling of fullness or pressure in the ear.  Symptoms of this condition can come and go and may last for up to 4 hours at a time.  When you have symptoms, it can be helpful to lie down on a flat surface and focus your eyes on one object that does not move. Try to stay in that position until your symptoms go away. This information is not intended to replace advice given to you by your health care provider. Make sure you discuss any questions you have with your health care provider. Document Released: 02/14/2000 Document Revised: 01/29/2017 Document Reviewed: 01/08/2016 Elsevier Patient Education  2020 [place] well hydrated and follow care instructions as listed above. Please use Promethazine 25mg  as needed for vertigo symptoms- no do not drive or drink alcohol when taking.  If nasal pressure, frontal headache, nasal drainage, and left ear ache continue for another 8 days- please contact clinic and we will send in antibiotic. If you develop fever, cough, vomiting, loss sense of smell/taste- recommend that you get tested for the [facility] Virus. Follow-up with Oral Maxillofacial Surgeon for continued TMJ pain. Follow-up as needed. Continue to social distance and wear a mask when in public. FEEL BETTER!

## 2018-12-12 NOTE — Assessment & Plan Note (Signed)
Follow-up with Oral Maxillofacial Surgeon

## 2018-12-12 NOTE — Assessment & Plan Note (Signed)
Remain well hydrated and follow care instructions as listed above. Please use Promethazine 25mg  as needed for vertigo symptoms- no do not drive or drink alcohol when taking.  If nasal pressure, frontal headache, nasal drainage, and left ear ache continue for another 8 days- please contact clinic and we will send in antibiotic. If you develop fever, cough, vomiting, loss sense of smell/taste- recommend that you get tested for the [facility] Virus. Follow-up as needed. Continue to social distance and wear a mask when in public.
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
## 2019-03-15 DIAGNOSIS — H1045 Other chronic allergic conjunctivitis: Secondary | ICD-10-CM | POA: Diagnosis not present

## 2019-03-15 DIAGNOSIS — J3089 Other allergic rhinitis: Secondary | ICD-10-CM | POA: Diagnosis not present

## 2019-03-15 DIAGNOSIS — J301 Allergic rhinitis due to pollen: Secondary | ICD-10-CM | POA: Diagnosis not present

## 2019-03-15 DIAGNOSIS — J3081 Allergic rhinitis due to animal (cat) (dog) hair and dander: Secondary | ICD-10-CM | POA: Diagnosis not present

## 2019-05-16 ENCOUNTER — Encounter (HOSPITAL_COMMUNITY): Payer: Self-pay | Admitting: Emergency Medicine

## 2019-05-16 ENCOUNTER — Emergency Department (HOSPITAL_COMMUNITY)
Admission: EM | Admit: 2019-05-16 | Discharge: 2019-05-17 | Discharge status: Against Medical Advice | Payer: BC Managed Care – PPO | Attending: Emergency Medicine | Admitting: Emergency Medicine

## 2019-05-16 ENCOUNTER — Emergency Department (HOSPITAL_COMMUNITY): Payer: BC Managed Care – PPO

## 2019-05-16 ENCOUNTER — Other Ambulatory Visit: Payer: Self-pay

## 2019-05-16 DIAGNOSIS — Z87891 Personal history of nicotine dependence: Secondary | ICD-10-CM | POA: Insufficient documentation

## 2019-05-16 DIAGNOSIS — R0602 Shortness of breath: Secondary | ICD-10-CM | POA: Insufficient documentation

## 2019-05-16 DIAGNOSIS — Z79899 Other long term (current) drug therapy: Secondary | ICD-10-CM | POA: Diagnosis not present

## 2019-05-16 DIAGNOSIS — U071 COVID-19: Secondary | ICD-10-CM | POA: Insufficient documentation

## 2019-05-16 LAB — CBC WITH DIFFERENTIAL/PLATELET
Abs Immature Granulocytes: 0.01 10*3/uL (ref 0.00–0.07)
Basophils Absolute: 0 10*3/uL (ref 0.0–0.1)
Basophils Relative: 0 %
Eosinophils Absolute: 0 10*3/uL (ref 0.0–0.5)
Eosinophils Relative: 0 %
HCT: 44.7 % (ref 39.0–52.0)
Hemoglobin: 14.9 g/dL (ref 13.0–17.0)
Immature Granulocytes: 0 %
Lymphocytes Relative: 19 %
Lymphs Abs: 0.9 10*3/uL (ref 0.7–4.0)
MCH: 31.2 pg (ref 26.0–34.0)
MCHC: 33.3 g/dL (ref 30.0–36.0)
MCV: 93.7 fL (ref 80.0–100.0)
Monocytes Absolute: 0.6 10*3/uL (ref 0.1–1.0)
Monocytes Relative: 12 %
Neutro Abs: 3.2 10*3/uL (ref 1.7–7.7)
Neutrophils Relative %: 69 %
Platelets: 229 10*3/uL (ref 150–400)
RBC: 4.77 MIL/uL (ref 4.22–5.81)
RDW: 11.8 % (ref 11.5–15.5)
WBC: 4.6 10*3/uL (ref 4.0–10.5)
nRBC: 0 % (ref 0.0–0.2)

## 2019-05-16 NOTE — ED Triage Notes (Signed)
Pt reports testing positive for COVID on March 8th, he was feeling better however today he has been experiencing extreme SOB, chest pain, fevers have returned, headache has returned.

## 2019-05-17 ENCOUNTER — Emergency Department (HOSPITAL_BASED_OUTPATIENT_CLINIC_OR_DEPARTMENT_OTHER)
Admission: EM | Admit: 2019-05-17 | Discharge: 2019-05-17 | Disposition: A | Discharge status: Home / Self Care | Payer: BC Managed Care – PPO | Source: Home / Self Care | Attending: Emergency Medicine | Admitting: Emergency Medicine

## 2019-05-17 ENCOUNTER — Encounter (HOSPITAL_BASED_OUTPATIENT_CLINIC_OR_DEPARTMENT_OTHER): Payer: Self-pay

## 2019-05-17 ENCOUNTER — Other Ambulatory Visit: Payer: Self-pay

## 2019-05-17 ENCOUNTER — Emergency Department (HOSPITAL_BASED_OUTPATIENT_CLINIC_OR_DEPARTMENT_OTHER): Payer: BC Managed Care – PPO

## 2019-05-17 DIAGNOSIS — R0602 Shortness of breath: Secondary | ICD-10-CM | POA: Diagnosis not present

## 2019-05-17 DIAGNOSIS — U071 COVID-19: Secondary | ICD-10-CM | POA: Diagnosis not present

## 2019-05-17 LAB — COMPREHENSIVE METABOLIC PANEL
ALT: 104 U/L — ABNORMAL HIGH (ref 0–44)
AST: 99 U/L — ABNORMAL HIGH (ref 15–41)
Albumin: 3.8 g/dL (ref 3.5–5.0)
Alkaline Phosphatase: 87 U/L (ref 38–126)
Anion gap: 11 (ref 5–15)
BUN: 16 mg/dL (ref 6–20)
CO2: 25 mmol/L (ref 22–32)
Calcium: 8.5 mg/dL — ABNORMAL LOW (ref 8.9–10.3)
Chloride: 100 mmol/L (ref 98–111)
Creatinine, Ser: 1.22 mg/dL (ref 0.61–1.24)
GFR calc Af Amer: >60 mL/min (ref >60)
GFR calc non Af Amer: >60 mL/min (ref >60)
Glucose, Bld: 95 mg/dL (ref 70–99)
Potassium: 4.3 mmol/L (ref 3.5–5.1)
Sodium: 136 mmol/L (ref 135–145)
Total Bilirubin: 0.7 mg/dL (ref 0.3–1.2)
Total Protein: 7 g/dL (ref 6.5–8.1)

## 2019-05-17 LAB — D-DIMER, QUANTITATIVE: D-Dimer, Quant: 0.3 ug/mL-FEU (ref 0.00–0.50)

## 2019-05-17 MED ORDER — DEXAMETHASONE SODIUM PHOSPHATE 10 MG/ML IJ SOLN
10.0000 mg | Freq: Once | INTRAMUSCULAR | Status: AC
  Administered 2019-05-17: 10 mg via INTRAVENOUS
  Filled 2019-05-17: qty 1

## 2019-05-17 MED ORDER — ONDANSETRON 4 MG PO TBDP: 4.0000 mg | ORAL_TABLET | Freq: Three times a day (TID) | ORAL | 0 refills | Status: DC | PRN

## 2019-05-17 MED ORDER — ALBUTEROL SULFATE HFA 108 (90 BASE) MCG/ACT IN AERS
4.0000 | INHALATION_SPRAY | Freq: Once | RESPIRATORY_TRACT | Status: AC
  Administered 2019-05-17: 4 via RESPIRATORY_TRACT
  Filled 2019-05-17: qty 6.7

## 2019-05-17 MED FILL — ONDANSETRON ODT 4 MG TABLET: 4 | 3 days supply | Qty: 9 | Fill #0

## 2019-05-17 NOTE — ED Notes (Signed)
Pt did not respond to name X3. 

## 2019-05-17 NOTE — Discharge Instructions (Signed)
General Viral Syndrome Care Instructions:  Your symptoms are likely consistent with a viral illness. Viruses do not require or respond to antibiotics. Treatment is symptomatic care and it is important to note that these symptoms may last for 7-14 days.   Hand washing: Wash your hands throughout the day, but especially before and after touching the face, using the restroom, sneezing, coughing, or touching surfaces that have been coughed or sneezed upon. Hydration: Symptoms of most illnesses will be intensified and complicated by dehydration. Dehydration can also extend the duration of symptoms. Drink plenty of fluids and get plenty of rest. You should be drinking at least half a liter of water an hour to stay hydrated. Electrolyte drinks (ex. Gatorade, Powerade, Pedialyte) are also encouraged. You should be drinking enough fluids to make your urine light yellow, almost clear. If this is not the case, you are not drinking enough water. Please note that some of the treatments indicated below will not be effective if you are not adequately hydrated. Diet: Please concentrate on hydration, however, you may introduce food slowly.  Start with a clear liquid diet, progressed to a full liquid diet, and then bland solids as you are able. Pain or fever: Ibuprofen, Naproxen, or acetaminophen (generic for Tylenol) for pain or fever.  Antiinflammatory medications: Take 600 mg of ibuprofen every 6 hours or 440 mg (over the counter dose) to 500 mg (prescription dose) of naproxen every 12 hours for the next 3 days. After this time, these medications may be used as needed for pain. Take these medications with food to avoid upset stomach. Choose only one of these medications, do not take them together. Acetaminophen (generic for Tylenol): Should you continue to have additional pain while taking the ibuprofen or naproxen, you may add in acetaminophen as needed. Your daily total maximum amount of acetaminophen from all sources  should be limited to 4000mg /day for persons without liver problems, or 2000mg /day for those with liver problems. Nausea/vomiting: Use the ondansetron (generic for Zofran) for nausea or vomiting.  This medication may not prevent all vomiting or nausea, but can help facilitate better hydration. Things that can help with nausea/vomiting also include peppermint/menthol candies, vitamin B12, and ginger. Diarrhea: May use medications such as loperamide (Imodium) or Bismuth subsalicylate (Pepto-Bismol). Cough: Teas, warm liquids, broths, and honey can help with cough. Albuterol: May use the albuterol as needed for instances of shortness of breath. Zyrtec or Claritin: May add these medication daily to control underlying symptoms of congestion, sneezing, and other signs of allergies.  These medications are available over-the-counter. Generics: Cetirizine (generic for Zyrtec) and loratadine (generic for Claritin). Fluticasone: Use fluticasone (generic for Flonase), as directed, for nasal and sinus congestion.  This medication is available over-the-counter. Congestion: Plain guaifenesin (generic for plain Mucinex) may help relieve congestion. Saline sinus rinses and saline nasal sprays may also help relieve congestion. If you do not have high blood pressure, heart problems, or an allergy to such medications, you may also try phenylephrine or Sudafed. Sore throat: Warm liquids or Chloraseptic spray may help soothe a sore throat. Gargle twice a day with a salt water solution made from a half teaspoon of salt in a cup of warm water.  Follow up: Follow up with a primary care provider within the next two weeks should symptoms fail to resolve. Return: Return to the ED for significantly worsening symptoms, shortness of breath, persistent vomiting, large amounts of blood in stool, or any other major concerns.  For prescription assistance, may try  using prescription discount sites or apps, such as goodrx.com  COVID-19  isolation recommendations  Patients who have symptoms consistent with COVID-19 should self isolate until: At least 3 days (72 hours) have passed since recovery, defined as resolution of fever without the use of fever reducing medications and improvement in respiratory symptoms (e.g., cough, shortness of breath), and At least 7 days have passed since symptoms first appeared. Retesting is not required and not recommended as patients can continue to test positive for several weeks despite lack of symptoms.

## 2019-05-17 NOTE — ED Notes (Signed)
Pt on monitor 

## 2019-05-17 NOTE — ED Triage Notes (Signed)
Tested +Covid March 8th, Monday began having SOB w dry cough. C/o pain with deep breaths. 99% on RA

## 2019-05-17 NOTE — ED Notes (Signed)
Patient educated on proper use of HFA medications with Aerochamber.  Patient demonstrates proper technique after education. 
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
## 2019-05-27 ENCOUNTER — Other Ambulatory Visit: Payer: Self-pay | Admitting: Adult Health

## 2019-05-29 ENCOUNTER — Other Ambulatory Visit: Payer: Self-pay

## 2019-05-29 ENCOUNTER — Other Ambulatory Visit: Payer: BC Managed Care – PPO

## 2019-05-29 ENCOUNTER — Other Ambulatory Visit: Payer: Self-pay | Admitting: Family Medicine

## 2019-05-29 DIAGNOSIS — E785 Hyperlipidemia, unspecified: Secondary | ICD-10-CM
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
## 2019-05-29 NOTE — Progress Notes (Signed)
From 11/2018 note: Please schedule fasting lab appt in 6 months to recheck cholesterol levels.

## 2019-05-30 ENCOUNTER — Other Ambulatory Visit: Payer: BC Managed Care – PPO

## 2019-06-16 ENCOUNTER — Other Ambulatory Visit: Payer: Self-pay | Admitting: Adult Health

## 2019-06-16 ENCOUNTER — Telehealth: Payer: Self-pay | Admitting: Adult Health

## 2019-06-16 ENCOUNTER — Telehealth: Payer: Self-pay

## 2019-06-16 NOTE — Telephone Encounter (Signed)
Patient is requesting a refill of his omeprazole, if approved please send to [facility] on [address].  Also, the patient thought he may be due for lab work but cannot recall if that is the case. Can we review this and if so let the front office staff know so they can contact patient to schedule.

## 2019-06-16 NOTE — Telephone Encounter (Signed)
RX for omeprazole was refilled today.  However, pt must have OV prior to any further refills.    Pt is due to have fasting lipid panel at this time.  Please call pt to schedule lab appointment as well.  Roger Martin, CMA

## 2019-06-16 NOTE — Telephone Encounter (Signed)
Please call pt to schedule appt.  No further refills until pt is seen.  T. Valdis Bevill, CMA  
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
## 2019-07-14 ENCOUNTER — Ambulatory Visit: Payer: BC Managed Care – PPO | Admitting: Physician Assistant

## 2019-07-14 ENCOUNTER — Other Ambulatory Visit: Payer: Self-pay

## 2019-07-14 ENCOUNTER — Encounter: Payer: Self-pay | Admitting: Physician Assistant

## 2019-07-14 VITALS — BP 139/82 | HR 109 | Temp 97.8°F | Ht 68.25 in | Wt 199.8 lb

## 2019-07-14 DIAGNOSIS — F909 Attention-deficit hyperactivity disorder, unspecified type: Secondary | ICD-10-CM

## 2019-07-14 DIAGNOSIS — R1013 Epigastric pain: Secondary | ICD-10-CM

## 2019-07-14 MED ORDER — OMEPRAZOLE 20 MG PO CPDR: 20.0000 mg | DELAYED_RELEASE_CAPSULE | Freq: Every day | ORAL | 1 refills | Status: DC

## 2019-07-14 MED ORDER — LISDEXAMFETAMINE DIMESYLATE 10 MG PO CAPS: 10.0000 mg | ORAL_CAPSULE | Freq: Every day | ORAL | 0 refills | Status: DC

## 2019-07-14 NOTE — Patient Instructions (Signed)

## 2019-07-17 ENCOUNTER — Telehealth: Payer: Self-pay | Admitting: Physician Assistant

## 2019-07-17 NOTE — Telephone Encounter (Signed)
Patient called states pharmacy can not fill this prescription ( A Pre -Authorization is Required) by Ins Co/ [employer]:  lisdexamfetamine (VYVANSE) 10 MG capsule [[id number]]   Order Details Dose: 10 mg Route: Oral Frequency: Daily  Dispense Quantity: 30 capsule Refills: 0       Sig: Take 1 capsule (10 mg total) by mouth daily.       ---Forwarding message to med asst to contact  [employer] regarding Medication requirements  --glh

## 2019-07-18 ENCOUNTER — Other Ambulatory Visit: Payer: Self-pay | Admitting: Physician Assistant

## 2019-07-18 DIAGNOSIS — F909 Attention-deficit hyperactivity disorder, unspecified type: Secondary | ICD-10-CM

## 2019-07-18 MED ORDER — METHYLPHENIDATE HCL ER (LA) 10 MG PO CP24: 10.0000 mg | ORAL_CAPSULE | Freq: Every day | ORAL | 0 refills | Status: DC

## 2019-07-18 NOTE — Telephone Encounter (Signed)
Roger Martin states she has placed request to change medication in [place], [place] box. AS, CMA

## 2019-07-18 NOTE — Telephone Encounter (Signed)
Spoke with Roger Martin who states she is changing medication to something covered by patients insurance. Patient is aware of this and verbalized understanding. AS, CMA

## 2019-07-25 ENCOUNTER — Encounter: Payer: Self-pay | Admitting: Physician Assistant

## 2019-07-25 DIAGNOSIS — L237 Allergic contact dermatitis due to plants, except food: Secondary | ICD-10-CM

## 2019-07-25 DIAGNOSIS — L247 Irritant contact dermatitis due to plants, except food: Secondary | ICD-10-CM | POA: Diagnosis not present

## 2019-07-26 MED ORDER — CLOBETASOL PROPIONATE 0.05 % EX CREA: 1.0000 "application " | TOPICAL_CREAM | Freq: Two times a day (BID) | CUTANEOUS | 0 refills | Status: DC

## 2019-08-17 ENCOUNTER — Encounter: Payer: Self-pay | Admitting: Physician Assistant

## 2019-08-17 DIAGNOSIS — F988 Other specified behavioral and emotional disorders with onset usually occurring in childhood and adolescence: Secondary | ICD-10-CM

## 2019-08-17 MED ORDER — METHYLPHENIDATE HCL 20 MG PO TABS: 20.0000 mg | ORAL_TABLET | Freq: Every day | ORAL | 0 refills | Status: DC

## 2019-09-21 ENCOUNTER — Other Ambulatory Visit: Payer: Self-pay | Admitting: Physician Assistant

## 2019-09-21 DIAGNOSIS — F988 Other specified behavioral and emotional disorders with onset usually occurring in childhood and adolescence: Secondary | ICD-10-CM

## 2019-09-21 NOTE — Telephone Encounter (Signed)
Patient last seen 07/14/19 and advised to follow up in 4-6 months.   Patient requesting refill of Methylphenidate 20mg .  Last refill given 08/17/19.  Please approve if refill deemed appropriate. AS, CMA

## 2019-09-22 MED ORDER — METHYLPHENIDATE HCL 20 MG PO TABS: 20.0000 mg | ORAL_TABLET | Freq: Every day | ORAL | 0 refills | Status: DC

## 2019-09-29 ENCOUNTER — Other Ambulatory Visit: Payer: Self-pay | Admitting: Physician Assistant

## 2019-09-29 DIAGNOSIS — F988 Other specified behavioral and emotional disorders with onset usually occurring in childhood and adolescence: Secondary | ICD-10-CM

## 2019-09-29 NOTE — Telephone Encounter (Signed)
MyChart message sent to pt.  T. Malon Siddall, CMA 

## 2019-09-29 NOTE — Telephone Encounter (Signed)
Error

## 2019-11-09 ENCOUNTER — Other Ambulatory Visit: Payer: Self-pay | Admitting: Physician Assistant

## 2019-11-09 DIAGNOSIS — F988 Other specified behavioral and emotional disorders with onset usually occurring in childhood and adolescence: Secondary | ICD-10-CM

## 2019-11-09 MED ORDER — METHYLPHENIDATE HCL 20 MG PO TABS: 20.0000 mg | ORAL_TABLET | Freq: Every day | ORAL | 0 refills | Status: DC

## 2019-11-09 NOTE — Telephone Encounter (Signed)
Patient last seen 07/14/2019 and advised to follow up in 4-6 months.   Last refill given 09/22/19 # 30 with no refills.    Please approve if deemed appropriate. AS, CMA

## 2019-11-13 DIAGNOSIS — N4889 Other specified disorders of penis: Secondary | ICD-10-CM | POA: Diagnosis not present

## 2019-11-13 DIAGNOSIS — N529 Male erectile dysfunction, unspecified: Secondary | ICD-10-CM | POA: Diagnosis not present

## 2019-12-22 ENCOUNTER — Other Ambulatory Visit: Payer: Self-pay

## 2019-12-22 ENCOUNTER — Other Ambulatory Visit: Payer: BC Managed Care – PPO

## 2019-12-22 DIAGNOSIS — E038 Other specified hypothyroidism: Secondary | ICD-10-CM

## 2019-12-22 DIAGNOSIS — Z0000 Encounter for general adult medical examination without abnormal findings: Secondary | ICD-10-CM

## 2019-12-25 ENCOUNTER — Other Ambulatory Visit: Payer: Self-pay

## 2019-12-25 ENCOUNTER — Other Ambulatory Visit: Payer: Self-pay | Admitting: Physician Assistant

## 2019-12-25 ENCOUNTER — Other Ambulatory Visit: Payer: BC Managed Care – PPO

## 2019-12-25 DIAGNOSIS — E038 Other specified hypothyroidism: Secondary | ICD-10-CM | POA: Diagnosis not present

## 2019-12-25 DIAGNOSIS — Z0000 Encounter for general adult medical examination without abnormal findings: Secondary | ICD-10-CM | POA: Diagnosis not present

## 2019-12-25 DIAGNOSIS — F988 Other specified behavioral and emotional disorders with onset usually occurring in childhood and adolescence: Secondary | ICD-10-CM

## 2019-12-25 MED ORDER — METHYLPHENIDATE HCL 20 MG PO TABS: 20.0000 mg | ORAL_TABLET | Freq: Every day | ORAL | 0 refills | Status: DC

## 2019-12-25 NOTE — Addendum Note (Signed)
Addended by: Stan Head on: 12/25/2019 02:59 PM   Modules accepted: Orders

## 2019-12-25 NOTE — Telephone Encounter (Signed)
Patient needs a refill on methylphenidate. Please send to [facility] on [address]. Thanks

## 2019-12-26 LAB — CBC WITH DIFFERENTIAL/PLATELET
Basophils Absolute: 0 10*3/uL (ref 0.0–0.2)
Basos: 1 %
EOS (ABSOLUTE): 0.2 10*3/uL (ref 0.0–0.4)
Eos: 4 %
Hematocrit: 41.3 % (ref 37.5–51.0)
Hemoglobin: 13.9 g/dL (ref 13.0–17.7)
Immature Grans (Abs): 0 10*3/uL (ref 0.0–0.1)
Immature Granulocytes: 0 %
Lymphocytes Absolute: 1 10*3/uL (ref 0.7–3.1)
Lymphs: 24 %
MCH: 31.2 pg (ref 26.6–33.0)
MCHC: 33.7 g/dL (ref 31.5–35.7)
MCV: 93 fL (ref 79–97)
Monocytes Absolute: 0.6 10*3/uL (ref 0.1–0.9)
Monocytes: 13 %
Neutrophils Absolute: 2.5 10*3/uL (ref 1.4–7.0)
Neutrophils: 58 %
Platelets: 237 10*3/uL (ref 150–450)
RBC: 4.46 x10E6/uL (ref 4.14–5.80)
RDW: 12.8 % (ref 11.6–15.4)
WBC: 4.3 10*3/uL (ref 3.4–10.8)

## 2019-12-26 LAB — COMPREHENSIVE METABOLIC PANEL
ALT: 21 IU/L (ref 0–44)
AST: 23 IU/L (ref 0–40)
Albumin/Globulin Ratio: 2.4 — ABNORMAL HIGH (ref 1.2–2.2)
Albumin: 4.5 g/dL (ref 3.8–4.9)
Alkaline Phosphatase: 87 IU/L (ref 44–121)
BUN/Creatinine Ratio: 18 (ref 9–20)
BUN: 17 mg/dL (ref 6–24)
Bilirubin Total: 0.9 mg/dL (ref 0.0–1.2)
CO2: 25 mmol/L (ref 20–29)
Calcium: 8.9 mg/dL (ref 8.7–10.2)
Chloride: 104 mmol/L (ref 96–106)
Creatinine, Ser: 0.96 mg/dL (ref 0.76–1.27)
GFR calc Af Amer: 104 mL/min/{1.73_m2} (ref >59)
GFR calc non Af Amer: 90 mL/min/{1.73_m2} (ref >59)
Globulin, Total: 1.9 g/dL (ref 1.5–4.5)
Glucose: 82 mg/dL (ref 65–99)
Potassium: 3.9 mmol/L (ref 3.5–5.2)
Sodium: 141 mmol/L (ref 134–144)
Total Protein: 6.4 g/dL (ref 6.0–8.5)

## 2019-12-26 LAB — HEMOGLOBIN A1C
Est. average glucose Bld gHb Est-mCnc: 108 mg/dL
Hgb A1c MFr Bld: 5.4 % (ref 4.8–5.6)

## 2019-12-26 LAB — LIPID PANEL
Chol/HDL Ratio: 3.6 ratio (ref 0.0–5.0)
Cholesterol, Total: 179 mg/dL (ref 100–199)
HDL: 50 mg/dL (ref >39)
LDL Chol Calc (NIH): 109 mg/dL — ABNORMAL HIGH (ref 0–99)
Triglycerides: 110 mg/dL (ref 0–149)
VLDL Cholesterol Cal: 20 mg/dL (ref 5–40)

## 2019-12-26 LAB — TSH: TSH: 3.88 u[IU]/mL (ref 0.450–4.500)

## 2019-12-29 ENCOUNTER — Encounter: Payer: Self-pay | Admitting: Physician Assistant

## 2019-12-29 ENCOUNTER — Ambulatory Visit (INDEPENDENT_AMBULATORY_CARE_PROVIDER_SITE_OTHER): Payer: BC Managed Care – PPO | Admitting: Physician Assistant

## 2019-12-29 ENCOUNTER — Other Ambulatory Visit: Payer: Self-pay

## 2019-12-29 VITALS — BP 110/74 | HR 85 | Ht 68.0 in | Wt 200.3 lb

## 2019-12-29 DIAGNOSIS — E7800 Pure hypercholesterolemia, unspecified: Secondary | ICD-10-CM

## 2019-12-29 DIAGNOSIS — R140 Abdominal distension (gaseous): Secondary | ICD-10-CM

## 2019-12-29 DIAGNOSIS — K429 Umbilical hernia without obstruction or gangrene: Secondary | ICD-10-CM

## 2019-12-29 DIAGNOSIS — R195 Other fecal abnormalities: Secondary | ICD-10-CM

## 2019-12-29 DIAGNOSIS — Z0000 Encounter for general adult medical examination without abnormal findings: Secondary | ICD-10-CM

## 2019-12-29 DIAGNOSIS — F909 Attention-deficit hyperactivity disorder, unspecified type: Secondary | ICD-10-CM

## 2019-12-29 NOTE — Patient Instructions (Signed)
Preventive Care 41-54 Years Old, Male Preventive care refers to lifestyle choices and visits with your health care provider that can promote health and wellness. This includes:  A yearly physical exam. This is also called an annual well check.  Regular dental and eye exams.  Immunizations.  Screening for certain conditions.  Healthy lifestyle choices, such as eating a healthy diet, getting regular exercise, not using drugs or products that contain nicotine and tobacco, and limiting alcohol use. What can I expect for my preventive care visit? Physical exam Your health care provider will check:  Height and weight. These may be used to calculate body mass index (BMI), which is a measurement that tells if you are at a healthy weight.  Heart rate and blood pressure.  Your skin for abnormal spots. Counseling Your health care provider may ask you questions about:  Alcohol, tobacco, and drug use.  Emotional well-being.  Home and relationship well-being.  Sexual activity.  Eating habits.  Work and work Statistician. What immunizations do I need?  Influenza (flu) vaccine  This is recommended every year. Tetanus, diphtheria, and pertussis (Tdap) vaccine  You may need a Td booster every 10 years. Varicella (chickenpox) vaccine  You may need this vaccine if you have not already been vaccinated. Zoster (shingles) vaccine  You may need this after age 64. Measles, mumps, and rubella (MMR) vaccine  You may need at least one dose of MMR if you were born in 1957 or later. You may also need a second dose. Pneumococcal conjugate (PCV13) vaccine  You may need this if you have certain conditions and were not previously vaccinated. Pneumococcal polysaccharide (PPSV23) vaccine  You may need one or two doses if you smoke cigarettes or if you have certain conditions. Meningococcal conjugate (MenACWY) vaccine  You may need this if you have certain conditions. Hepatitis A  vaccine  You may need this if you have certain conditions or if you travel or work in places where you may be exposed to hepatitis A. Hepatitis B vaccine  You may need this if you have certain conditions or if you travel or work in places where you may be exposed to hepatitis B. Haemophilus influenzae type b (Hib) vaccine  You may need this if you have certain risk factors. Human papillomavirus (HPV) vaccine  If recommended by your health care provider, you may need three doses over 6 months. You may receive vaccines as individual doses or as more than one vaccine together in one shot (combination vaccines). Talk with your health care provider about the risks and benefits of combination vaccines. What tests do I need? Blood tests  Lipid and cholesterol levels. These may be checked every 5 years, or more frequently if you are over 60 years old.  Hepatitis C test.  Hepatitis B test. Screening  Lung cancer screening. You may have this screening every year starting at age 43 if you have a 30-pack-year history of smoking and currently smoke or have quit within the past 15 years.  Prostate cancer screening. Recommendations will vary depending on your family history and other risks.  Colorectal cancer screening. All adults should have this screening starting at age 72 and continuing until age 2. Your health care provider may recommend screening at age 14 if you are at increased risk. You will have tests every 1-10 years, depending on your results and the type of screening test.  Diabetes screening. This is done by checking your blood sugar (glucose) after you have not eaten  for a while (fasting). You may have this done every 1-3 years.  Sexually transmitted disease (STD) testing. Follow these instructions at home: Eating and drinking  Eat a diet that includes fresh fruits and vegetables, whole grains, lean protein, and low-fat dairy products.  Take vitamin and mineral supplements as  recommended by your health care provider.  Do not drink alcohol if your health care provider tells you not to drink.  If you drink alcohol: ? Limit how much you have to 0-2 drinks a day. ? Be aware of how much alcohol is in your drink. In the [place] one drink equals one 12 oz bottle of beer (355 mL), one 5 oz glass of wine (148 mL), or one 1 oz glass of hard liquor (44 mL). Lifestyle  Take daily care of your teeth and gums.  Stay active. Exercise for at least 30 minutes on 5 or more days each week.  Do not use any products that contain nicotine or tobacco, such as cigarettes, e-cigarettes, and chewing tobacco. If you need help quitting, ask your health care provider.  If you are sexually active, practice safe sex. Use a condom or other form of protection to prevent STIs (sexually transmitted infections).  Talk with your health care provider about taking a low-dose aspirin every day starting at age 53. What's next?  Go to your health care provider once a year for a well check visit.  Ask your health care provider how often you should have your eyes and teeth checked.  Stay up to date on all vaccines. This information is not intended to replace advice given to you by your health care provider. Make sure you discuss any questions you have with your health care provider. Document Revised: 02/10/2018 Document Reviewed: 02/10/2018 Elsevier Patient Education  2020 [employer].
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
## 2020-01-16 ENCOUNTER — Other Ambulatory Visit: Payer: Self-pay | Admitting: Physician Assistant

## 2020-03-21 DIAGNOSIS — J301 Allergic rhinitis due to pollen: Secondary | ICD-10-CM | POA: Diagnosis not present

## 2020-03-21 DIAGNOSIS — H1045 Other chronic allergic conjunctivitis: Secondary | ICD-10-CM | POA: Diagnosis not present

## 2020-03-21 DIAGNOSIS — J3089 Other allergic rhinitis: Secondary | ICD-10-CM | POA: Diagnosis not present

## 2020-03-21 DIAGNOSIS — J3081 Allergic rhinitis due to animal (cat) (dog) hair and dander: Secondary | ICD-10-CM | POA: Diagnosis not present

## 2020-07-31 ENCOUNTER — Other Ambulatory Visit: Payer: Self-pay | Admitting: Physician Assistant

## 2020-07-31 NOTE — Telephone Encounter (Signed)
Patient denied the appointment due to no longer taking that medication. He did schedule for a physical though. Thanks

## 2020-07-31 NOTE — Telephone Encounter (Signed)
Can you reach out to the patient to schedule a follow up appointment per his last AVS. He was suppose to follow up in February 2022. -WA, CMA

## 2020-11-21 ENCOUNTER — Other Ambulatory Visit: Payer: Self-pay | Admitting: Physician Assistant

## 2020-11-26 ENCOUNTER — Ambulatory Visit
Admission: EM | Admit: 2020-11-26 | Discharge: 2020-11-26 | Disposition: A | Discharge status: Home / Self Care | Payer: BC Managed Care – PPO | Attending: Urgent Care | Admitting: Urgent Care

## 2020-11-26 ENCOUNTER — Other Ambulatory Visit: Payer: Self-pay

## 2020-11-26 ENCOUNTER — Encounter: Payer: Self-pay | Admitting: Emergency Medicine

## 2020-11-26 DIAGNOSIS — M545 Low back pain, unspecified: Secondary | ICD-10-CM

## 2020-11-26 DIAGNOSIS — S39012A Strain of muscle, fascia and tendon of lower back, initial encounter: Secondary | ICD-10-CM

## 2020-11-26 MED ORDER — TIZANIDINE HCL 4 MG PO TABS: 4.0000 mg | ORAL_TABLET | Freq: Every day | ORAL | 0 refills | Status: DC

## 2020-11-26 MED ORDER — KETOROLAC TROMETHAMINE 60 MG/2ML IM SOLN
60.0000 mg | Freq: Once | INTRAMUSCULAR | Status: AC
  Administered 2020-11-26: 60 mg via INTRAMUSCULAR

## 2020-11-26 MED ORDER — NAPROXEN 500 MG PO TABS: 500.0000 mg | ORAL_TABLET | Freq: Two times a day (BID) | ORAL | 0 refills | Status: DC

## 2020-11-26 NOTE — ED Triage Notes (Signed)
Pulled back muscle yesterday that initially went away, came back this morning with increasing intensity. Denies numbness tingling down legs, loss of bowel/bladder control. Reports feet warm, sensation intact.
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
## 2020-12-14 ENCOUNTER — Ambulatory Visit
Admission: EM | Admit: 2020-12-14 | Discharge: 2020-12-14 | Disposition: A | Discharge status: Home / Self Care | Payer: BC Managed Care – PPO | Attending: Physician Assistant | Admitting: Physician Assistant

## 2020-12-14 ENCOUNTER — Encounter: Payer: Self-pay | Admitting: Physician Assistant

## 2020-12-14 DIAGNOSIS — J309 Allergic rhinitis, unspecified: Secondary | ICD-10-CM | POA: Diagnosis not present

## 2020-12-14 MED ORDER — PREDNISONE 20 MG PO TABS: 40.0000 mg | ORAL_TABLET | Freq: Every day | ORAL | 0 refills | Status: AC

## 2020-12-14 NOTE — ED Triage Notes (Signed)
Patient presents to Urgent Care with complaints of right ear pain, sore throat (only when lying flat), headache, and facial pressure since Tuesday. Pt states symptoms worsened yesterday. Treating symptoms with advil, allegra, and nasal spray. Pt has a hx of seasonal allergies.   Denies fever.
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
## 2020-12-16 ENCOUNTER — Other Ambulatory Visit: Payer: Self-pay

## 2020-12-16 ENCOUNTER — Telehealth: Payer: Self-pay | Admitting: Emergency Medicine

## 2020-12-16 ENCOUNTER — Ambulatory Visit
Admission: EM | Admit: 2020-12-16 | Discharge: 2020-12-16 | Disposition: A | Discharge status: Home / Self Care | Payer: BC Managed Care – PPO | Attending: Internal Medicine | Admitting: Internal Medicine

## 2020-12-16 ENCOUNTER — Encounter: Payer: Self-pay | Admitting: Emergency Medicine

## 2020-12-16 DIAGNOSIS — H65191 Other acute nonsuppurative otitis media, right ear: Secondary | ICD-10-CM | POA: Diagnosis not present

## 2020-12-16 DIAGNOSIS — J069 Acute upper respiratory infection, unspecified: Secondary | ICD-10-CM

## 2020-12-16 MED ORDER — AMOXICILLIN 875 MG PO TABS: 875.0000 mg | ORAL_TABLET | Freq: Two times a day (BID) | ORAL | 0 refills | Status: AC

## 2020-12-16 NOTE — Discharge Instructions (Addendum)
You are being treated with amoxicillin antibiotic for right ear infection and upper respiratory infection.
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
## 2020-12-16 NOTE — Telephone Encounter (Signed)
Returned patient's phone call requesting a new prescription.  Patient states that his sx's have not improved but are actually worse.  Advised patient that he would need to be re-evaluated here in Urgent Care or f/u with his PCP.  Patient states that he would go somewhere else and disconnected call.

## 2020-12-16 NOTE — ED Triage Notes (Signed)
Was prescribed prednisone Saturday. Has not finished the prescription. States he was told if he wasn't improving to come back for an antibiotic. C/o sore throat with ear ache

## 2020-12-17 ENCOUNTER — Encounter: Payer: Self-pay | Admitting: Physician Assistant

## 2020-12-17 ENCOUNTER — Ambulatory Visit (INDEPENDENT_AMBULATORY_CARE_PROVIDER_SITE_OTHER): Payer: BC Managed Care – PPO | Admitting: Physician Assistant

## 2020-12-17 VITALS — Ht 69.0 in | Wt 196.0 lb

## 2020-12-17 DIAGNOSIS — F988 Other specified behavioral and emotional disorders with onset usually occurring in childhood and adolescence: Secondary | ICD-10-CM

## 2020-12-17 DIAGNOSIS — K219 Gastro-esophageal reflux disease without esophagitis: Secondary | ICD-10-CM | POA: Diagnosis not present

## 2020-12-17 MED ORDER — OMEPRAZOLE 20 MG PO CPDR: DELAYED_RELEASE_CAPSULE | ORAL | 1 refills | Status: DC

## 2020-12-17 MED ORDER — METHYLPHENIDATE HCL 20 MG PO TABS: 20.0000 mg | ORAL_TABLET | Freq: Every day | ORAL | 0 refills | Status: DC

## 2020-12-17 NOTE — Progress Notes (Signed)
Telehealth office visit note for Roger Masker, PA-C- at [facility]   I connected with current patient today by telephone and verified that I am speaking with the correct person    Location of the patient: Home  Location of the provider: Office - This visit type was conducted due to national recommendations for restrictions regarding the COVID-19 Pandemic (e.g. social distancing) in an effort to limit this patient's exposure and mitigate transmission in our community.    - No physical exam could be performed with this format, beyond that communicated to [place] by the patient/ family members as noted.   - Additionally my office staff/ schedulers were to discuss with the patient that there may be a monetary charge related to this service, depending on their medical insurance.  My understanding is that patient understood and consented to proceed.     _________________________________________________________________________________   History of Present Illness: Patient calls in to discuss restarting methylphenidate.  Patient reports self-discontinued medication about a year ago but is currently having a difficult time at work with concentrating and focusing. Tolerated medication without issues. States is currently taking antibiotic for ear infection. Also requesting refill of pantoprazole for acid reflux. Reports if goes without medication for more than 3 days will have heartburn symptoms.      GAD 7 : Generalized Anxiety Score 12/17/2020  Nervous, Anxious, on Edge 0  Control/stop worrying 0  Worry too much - different things 0  Trouble relaxing 0  Restless 0  Easily annoyed or irritable 0  Afraid - awful might happen 0  Total GAD 7 Score 0  Anxiety Difficulty Not difficult at all    Depression screen [facility] 2/9 12/17/2020 12/29/2019 07/14/2019 12/12/2018 11/28/2018  Decreased Interest 0 0 0 0 0  Down, Depressed, Hopeless 0 0 0 0 0  PHQ - 2 Score 0 0 0 0 0  Altered  sleeping 0 0 0 0 0  Tired, decreased energy 0 0 0 0 0  Change in appetite 0 0 0 0 0  Feeling bad or failure about yourself  0 0 0 0 0  Trouble concentrating 0 0 0 0 0  Moving slowly or fidgety/restless 0 0 0 0 0  Suicidal thoughts 0 0 0 0 0  PHQ-9 Score 0 0 0 0 0      Impression and Recommendations:     1. Gastroesophageal reflux disease, unspecified whether esophagitis present   2. Attention deficit disorder, unspecified hyperactivity presence      ADHD: -Will restart methylphenidate 20 mg once daily. Discussed with patient controlled substance policy including follow-up every 3 months for medication management. Will obtain controlled substance contract at scheduled follow up visit next week.   GERD: -Stable. -Continue current medication regimen. Provided refill. -Will continue to monitor.  - As part of my medical decision making, I reviewed the following data within the electronic MEDICAL RECORD NUMBER History obtained from pt /family, CMA notes reviewed and incorporated if applicable, Labs reviewed, Radiograph/ tests reviewed if applicable and OV notes from prior OV's with me, as well as any other specialists she/he has seen since seeing me last, were all reviewed and used in my medical decision making process today.    - Additionally, when appropriate, discussion had with patient regarding our treatment plan, and their biases/concerns about that plan were used in my medical decision making today.    - The patient agreed with the plan and demonstrated an understanding of  the instructions.   No barriers to understanding were identified.     - The patient was advised to call back or seek an in-person evaluation if the symptoms worsen or if the condition fails to improve as anticipated.   Return for as scheduled .    No orders of the defined types were placed in this encounter.   Meds ordered this encounter  Medications   methylphenidate (RITALIN) 20 MG tablet    Sig: Take 1  tablet (20 mg total) by mouth daily.    Dispense:  90 tablet    Refill:  0    Order Specific Question:   Supervising Provider    Answer:   Nani Gasser D [[id number]]   omeprazole (PRILOSEC) 20 MG capsule    Sig: TAKE 1 CAPSULE(20 MG) BY MOUTH DAILY    Dispense:  90 capsule    Refill:  1    Order Specific Question:   Supervising Provider    Answer:   Nani Gasser D [[id number]]    Medications Discontinued During This Encounter  Medication Reason   naproxen (NAPROSYN) 500 MG tablet Patient Preference   promethazine (PHENERGAN) 25 MG tablet Patient Preference   tiZANidine (ZANAFLEX) 4 MG tablet Patient Preference   methylphenidate (RITALIN) 20 MG tablet Reorder   omeprazole (PRILOSEC) 20 MG capsule Reorder       Time spent on telephone encounter was 8 minutes.    Note:  This note was prepared with assistance of Dragon voice recognition software. Occasional wrong-word or sound-a-like substitutions may have occurred due to the inherent limitations of voice recognition software.   The 21st Century Cures Act was signed into law in 2016 which includes the topic of electronic health records.  This provides immediate access to information in MyChart.  This includes consultation notes, operative notes, office notes, lab results and pathology reports.  If you have any questions about what you read please let us know at your next visit or call us at the office.  We are right here with you.   __________________________________________________________________________________     Patient Care Team    Relationship Specialty Notifications Start End  Roger Martin, [place] PCP - General Physician Assistant  07/14/19      -Vitals obtained; medications/ allergies reconciled;  personal medical, social, Sx etc.histories were updated by CMA, reviewed by me and are reflected in chart   Patient Active Problem List   Diagnosis Date Noted   TMJ arthralgia 12/12/2018   History of vertigo  11/28/2018   Sciatica, left side 08/12/2018   Healthcare maintenance 08/11/2018   Liver injury 08/11/2018   Mass of parotid gland 06/10/2017   Partial facial nerve palsy 06/10/2017   Thrombosed external hemorrhoid 03/12/2016   Subclinical hypothyroidism 05/28/2015   Other specified hearing loss, left ear 10/30/2014   Meniere's disease of left ear 03/29/2013   ADD (attention deficit disorder) 08/04/2012   Allergic rhinitis 11/20/2010   Dyspepsia 10/05/2008   DIARRHEA 10/05/2008     No outpatient medications have been marked as taking for the 12/17/20 encounter (Office Visit) with Roger Masker, PA-C.     Allergies:  No Known Allergies   ROS:  See above HPI for pertinent positives and negatives   Objective:   Height 5\' 9"  (1.753 m), weight 196 lb (88.9 kg).  (if some vitals are omitted, this means that patient was UNABLE to obtain them.) General: A & O * 3; sounds in no acute distress Respiratory: speaking in full sentences, no conversational dyspnea  Psych: insight appears good, mood- appears full

## 2020-12-23 ENCOUNTER — Encounter: Payer: BC Managed Care – PPO | Admitting: Physician Assistant

## 2020-12-26 DIAGNOSIS — D485 Neoplasm of uncertain behavior of skin: Secondary | ICD-10-CM | POA: Diagnosis not present

## 2020-12-26 DIAGNOSIS — L814 Other melanin hyperpigmentation: Secondary | ICD-10-CM | POA: Diagnosis not present

## 2020-12-26 DIAGNOSIS — D225 Melanocytic nevi of trunk: Secondary | ICD-10-CM | POA: Diagnosis not present

## 2020-12-26 DIAGNOSIS — L309 Dermatitis, unspecified: Secondary | ICD-10-CM | POA: Diagnosis not present

## 2020-12-26 DIAGNOSIS — L821 Other seborrheic keratosis: Secondary | ICD-10-CM | POA: Diagnosis not present

## 2021-01-01 ENCOUNTER — Other Ambulatory Visit: Payer: Self-pay

## 2021-01-01 DIAGNOSIS — Z130 Encounter for screening for diseases of the blood and blood-forming organs and certain disorders involving the immune mechanism: Secondary | ICD-10-CM

## 2021-01-01 DIAGNOSIS — Z125 Encounter for screening for malignant neoplasm of prostate: Secondary | ICD-10-CM

## 2021-01-01 DIAGNOSIS — Z0000 Encounter for general adult medical examination without abnormal findings: Secondary | ICD-10-CM

## 2021-01-01 DIAGNOSIS — Z1321 Encounter for screening for nutritional disorder: Secondary | ICD-10-CM

## 2021-01-03 ENCOUNTER — Other Ambulatory Visit: Payer: BC Managed Care – PPO

## 2021-01-03 ENCOUNTER — Other Ambulatory Visit: Payer: Self-pay

## 2021-01-03 DIAGNOSIS — Z1321 Encounter for screening for nutritional disorder: Secondary | ICD-10-CM

## 2021-01-03 DIAGNOSIS — Z1329 Encounter for screening for other suspected endocrine disorder: Secondary | ICD-10-CM | POA: Diagnosis not present

## 2021-01-03 DIAGNOSIS — Z13228 Encounter for screening for other metabolic disorders: Secondary | ICD-10-CM | POA: Diagnosis not present

## 2021-01-03 DIAGNOSIS — Z130 Encounter for screening for diseases of the blood and blood-forming organs and certain disorders involving the immune mechanism: Secondary | ICD-10-CM

## 2021-01-03 DIAGNOSIS — Z0000 Encounter for general adult medical examination without abnormal findings: Secondary | ICD-10-CM

## 2021-01-03 DIAGNOSIS — Z125 Encounter for screening for malignant neoplasm of prostate: Secondary | ICD-10-CM | POA: Diagnosis not present

## 2021-01-04 LAB — LIPID PANEL
Chol/HDL Ratio: 3.8 ratio (ref 0.0–5.0)
Cholesterol, Total: 204 mg/dL — ABNORMAL HIGH (ref 100–199)
HDL: 54 mg/dL (ref >39)
LDL Chol Calc (NIH): 132 mg/dL — ABNORMAL HIGH (ref 0–99)
Triglycerides: 100 mg/dL (ref 0–149)
VLDL Cholesterol Cal: 18 mg/dL (ref 5–40)

## 2021-01-04 LAB — CBC WITH DIFFERENTIAL/PLATELET
Basophils Absolute: 0 10*3/uL (ref 0.0–0.2)
Basos: 1 %
EOS (ABSOLUTE): 0.2 10*3/uL (ref 0.0–0.4)
Eos: 4 %
Hematocrit: 43.7 % (ref 37.5–51.0)
Hemoglobin: 14.7 g/dL (ref 13.0–17.7)
Immature Grans (Abs): 0 10*3/uL (ref 0.0–0.1)
Immature Granulocytes: 0 %
Lymphocytes Absolute: 1.3 10*3/uL (ref 0.7–3.1)
Lymphs: 27 %
MCH: 30.4 pg (ref 26.6–33.0)
MCHC: 33.6 g/dL (ref 31.5–35.7)
MCV: 91 fL (ref 79–97)
Monocytes Absolute: 0.7 10*3/uL (ref 0.1–0.9)
Monocytes: 14 %
Neutrophils Absolute: 2.6 10*3/uL (ref 1.4–7.0)
Neutrophils: 54 %
Platelets: 248 10*3/uL (ref 150–450)
RBC: 4.83 x10E6/uL (ref 4.14–5.80)
RDW: 12.1 % (ref 11.6–15.4)
WBC: 4.8 10*3/uL (ref 3.4–10.8)

## 2021-01-04 LAB — COMPREHENSIVE METABOLIC PANEL
ALT: 21 IU/L (ref 0–44)
AST: 21 IU/L (ref 0–40)
Albumin/Globulin Ratio: 1.8 (ref 1.2–2.2)
Albumin: 4.6 g/dL (ref 3.8–4.9)
Alkaline Phosphatase: 87 IU/L (ref 44–121)
BUN/Creatinine Ratio: 10 (ref 9–20)
BUN: 13 mg/dL (ref 6–24)
Bilirubin Total: 0.9 mg/dL (ref 0.0–1.2)
CO2: 26 mmol/L (ref 20–29)
Calcium: 9.3 mg/dL (ref 8.7–10.2)
Chloride: 101 mmol/L (ref 96–106)
Creatinine, Ser: 1.29 mg/dL — ABNORMAL HIGH (ref 0.76–1.27)
Globulin, Total: 2.5 g/dL (ref 1.5–4.5)
Glucose: 88 mg/dL (ref 70–99)
Potassium: 4.3 mmol/L (ref 3.5–5.2)
Sodium: 140 mmol/L (ref 134–144)
Total Protein: 7.1 g/dL (ref 6.0–8.5)
eGFR: 66 mL/min/{1.73_m2} (ref >59)

## 2021-01-04 LAB — HEMOGLOBIN A1C
Est. average glucose Bld gHb Est-mCnc: 108 mg/dL
Hgb A1c MFr Bld: 5.4 % (ref 4.8–5.6)

## 2021-01-04 LAB — TSH: TSH: 3.07 u[IU]/mL (ref 0.450–4.500)

## 2021-01-04 LAB — PSA: Prostate Specific Ag, Serum: 4.2 ng/mL — ABNORMAL HIGH (ref 0.0–4.0)

## 2021-01-07 ENCOUNTER — Ambulatory Visit (INDEPENDENT_AMBULATORY_CARE_PROVIDER_SITE_OTHER): Payer: BC Managed Care – PPO | Admitting: Physician Assistant

## 2021-01-07 ENCOUNTER — Other Ambulatory Visit: Payer: Self-pay

## 2021-01-07 ENCOUNTER — Encounter: Payer: Self-pay | Admitting: Physician Assistant

## 2021-01-07 VITALS — BP 136/84 | HR 81 | Temp 98.1°F | Ht 69.0 in | Wt 196.0 lb

## 2021-01-07 DIAGNOSIS — R6882 Decreased libido: Secondary | ICD-10-CM

## 2021-01-07 DIAGNOSIS — Z23 Encounter for immunization: Secondary | ICD-10-CM | POA: Diagnosis not present

## 2021-01-07 DIAGNOSIS — Z87898 Personal history of other specified conditions: Secondary | ICD-10-CM

## 2021-01-07 DIAGNOSIS — R972 Elevated prostate specific antigen [PSA]: Secondary | ICD-10-CM | POA: Diagnosis not present

## 2021-01-07 DIAGNOSIS — H1045 Other chronic allergic conjunctivitis: Secondary | ICD-10-CM | POA: Insufficient documentation

## 2021-01-07 DIAGNOSIS — R5383 Other fatigue: Secondary | ICD-10-CM

## 2021-01-07 DIAGNOSIS — Z0000 Encounter for general adult medical examination without abnormal findings: Secondary | ICD-10-CM | POA: Diagnosis not present

## 2021-01-07 DIAGNOSIS — R197 Diarrhea, unspecified: Secondary | ICD-10-CM

## 2021-01-07 MED ORDER — ONDANSETRON 4 MG PO TBDP: 4.0000 mg | ORAL_TABLET | Freq: Three times a day (TID) | ORAL | 0 refills | Status: DC | PRN
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
## 2021-01-07 NOTE — Patient Instructions (Signed)

## 2021-01-28 DIAGNOSIS — H11003 Unspecified pterygium of eye, bilateral: Secondary | ICD-10-CM | POA: Diagnosis not present

## 2021-03-13 ENCOUNTER — Other Ambulatory Visit: Payer: Self-pay | Admitting: Physician Assistant

## 2021-03-13 DIAGNOSIS — K219 Gastro-esophageal reflux disease without esophagitis: Secondary | ICD-10-CM

## 2021-04-29 DIAGNOSIS — H11002 Unspecified pterygium of left eye: Secondary | ICD-10-CM | POA: Diagnosis not present

## 2021-05-12 DIAGNOSIS — J3081 Allergic rhinitis due to animal (cat) (dog) hair and dander: Secondary | ICD-10-CM | POA: Diagnosis not present

## 2021-05-12 DIAGNOSIS — J301 Allergic rhinitis due to pollen: Secondary | ICD-10-CM | POA: Diagnosis not present

## 2021-05-12 DIAGNOSIS — H1045 Other chronic allergic conjunctivitis: Secondary | ICD-10-CM | POA: Diagnosis not present

## 2021-05-12 DIAGNOSIS — J3089 Other allergic rhinitis: Secondary | ICD-10-CM | POA: Diagnosis not present

## 2021-05-15 DIAGNOSIS — J00 Acute nasopharyngitis [common cold]: Secondary | ICD-10-CM | POA: Diagnosis not present

## 2021-05-15 DIAGNOSIS — Z03818 Encounter for observation for suspected exposure to other biological agents ruled out: Secondary | ICD-10-CM | POA: Diagnosis not present

## 2021-05-15 DIAGNOSIS — R52 Pain, unspecified: Secondary | ICD-10-CM | POA: Diagnosis not present

## 2021-05-15 DIAGNOSIS — Z20822 Contact with and (suspected) exposure to covid-19: Secondary | ICD-10-CM | POA: Diagnosis not present

## 2021-06-11 DIAGNOSIS — H11001 Unspecified pterygium of right eye: Secondary | ICD-10-CM | POA: Diagnosis not present

## 2021-06-11 DIAGNOSIS — Z01818 Encounter for other preprocedural examination: Secondary | ICD-10-CM | POA: Diagnosis not present

## 2021-06-12 ENCOUNTER — Encounter (HOSPITAL_BASED_OUTPATIENT_CLINIC_OR_DEPARTMENT_OTHER): Payer: Self-pay | Admitting: *Deleted

## 2021-06-12 ENCOUNTER — Emergency Department (HOSPITAL_BASED_OUTPATIENT_CLINIC_OR_DEPARTMENT_OTHER): Payer: BC Managed Care – PPO

## 2021-06-12 ENCOUNTER — Emergency Department (HOSPITAL_BASED_OUTPATIENT_CLINIC_OR_DEPARTMENT_OTHER)
Admission: EM | Admit: 2021-06-12 | Discharge: 2021-06-12 | Disposition: A | Discharge status: Home / Self Care | Payer: BC Managed Care – PPO | Attending: Emergency Medicine | Admitting: Emergency Medicine

## 2021-06-12 ENCOUNTER — Other Ambulatory Visit: Payer: Self-pay

## 2021-06-12 DIAGNOSIS — R258 Other abnormal involuntary movements: Secondary | ICD-10-CM | POA: Diagnosis not present

## 2021-06-12 DIAGNOSIS — R55 Syncope and collapse: Secondary | ICD-10-CM | POA: Insufficient documentation

## 2021-06-12 DIAGNOSIS — J320 Chronic maxillary sinusitis: Secondary | ICD-10-CM | POA: Diagnosis not present

## 2021-06-12 DIAGNOSIS — M47812 Spondylosis without myelopathy or radiculopathy, cervical region: Secondary | ICD-10-CM | POA: Diagnosis not present

## 2021-06-12 DIAGNOSIS — S022XXA Fracture of nasal bones, initial encounter for closed fracture: Secondary | ICD-10-CM | POA: Diagnosis not present

## 2021-06-12 DIAGNOSIS — Z8616 Personal history of COVID-19: Secondary | ICD-10-CM | POA: Insufficient documentation

## 2021-06-12 DIAGNOSIS — R278 Other lack of coordination: Secondary | ICD-10-CM | POA: Diagnosis not present

## 2021-06-12 DIAGNOSIS — J341 Cyst and mucocele of nose and nasal sinus: Secondary | ICD-10-CM | POA: Diagnosis not present

## 2021-06-12 DIAGNOSIS — H1189 Other specified disorders of conjunctiva: Secondary | ICD-10-CM | POA: Diagnosis not present

## 2021-06-12 DIAGNOSIS — R279 Unspecified lack of coordination: Secondary | ICD-10-CM

## 2021-06-12 LAB — CBC WITH DIFFERENTIAL/PLATELET
Abs Immature Granulocytes: 0.02 10*3/uL (ref 0.00–0.07)
Basophils Absolute: 0 10*3/uL (ref 0.0–0.1)
Basophils Relative: 1 %
Eosinophils Absolute: 0.1 10*3/uL (ref 0.0–0.5)
Eosinophils Relative: 2 %
HCT: 41.7 % (ref 39.0–52.0)
Hemoglobin: 13.9 g/dL (ref 13.0–17.0)
Immature Granulocytes: 0 %
Lymphocytes Relative: 19 %
Lymphs Abs: 1.1 10*3/uL (ref 0.7–4.0)
MCH: 30.3 pg (ref 26.0–34.0)
MCHC: 33.3 g/dL (ref 30.0–36.0)
MCV: 91 fL (ref 80.0–100.0)
Monocytes Absolute: 0.6 10*3/uL (ref 0.1–1.0)
Monocytes Relative: 10 %
Neutro Abs: 4.1 10*3/uL (ref 1.7–7.7)
Neutrophils Relative %: 68 %
Platelets: 279 10*3/uL (ref 150–400)
RBC: 4.58 MIL/uL (ref 4.22–5.81)
RDW: 12.9 % (ref 11.5–15.5)
WBC: 6 10*3/uL (ref 4.0–10.5)
nRBC: 0 % (ref 0.0–0.2)

## 2021-06-12 LAB — BASIC METABOLIC PANEL
Anion gap: 6 (ref 5–15)
BUN: 16 mg/dL (ref 6–20)
CO2: 27 mmol/L (ref 22–32)
Calcium: 9.1 mg/dL (ref 8.9–10.3)
Chloride: 105 mmol/L (ref 98–111)
Creatinine, Ser: 1.09 mg/dL (ref 0.61–1.24)
GFR, Estimated: >60 mL/min (ref >60)
Glucose, Bld: 100 mg/dL — ABNORMAL HIGH (ref 70–99)
Potassium: 4.4 mmol/L (ref 3.5–5.1)
Sodium: 138 mmol/L (ref 135–145)

## 2021-06-12 LAB — TROPONIN I (HIGH SENSITIVITY): Troponin I (High Sensitivity): <2 ng/L (ref <18)

## 2021-06-12 MED ORDER — IOHEXOL 350 MG/ML SOLN
100.0000 mL | Freq: Once | INTRAVENOUS | Status: AC | PRN
  Administered 2021-06-12: 100 mL via INTRAVENOUS

## 2021-06-12 NOTE — ED Notes (Signed)
Wife stated beside the slurred speech and difficulty speak he was very diaphoretic, had weakness in both arms and legs, difficulty walking. Layed down at home and awoke feeling much better and was able to eat dinner. Denies any of these symptoms currently.  ?

## 2021-06-12 NOTE — ED Notes (Signed)
BEFAST Negative 

## 2021-06-12 NOTE — ED Notes (Signed)
ED Provider at bedside. 

## 2021-06-12 NOTE — ED Provider Notes (Signed)
?[facility] EMERGENCY DEPARTMENT ?Provider Note ? ? ?CSN: [id number] ?Arrival date & time: 06/12/21  0850 ? ?  ? ?History ? ?Chief Complaint  ?Patient presents with  ? Loss of Consciousness  ? ? ?Roger Martin is a 56 y.o. male. ? ?HPI ? ?  ? ?56 year old male with history of GERD, meniere's disease presents with concern for syncopal event which occurred later in the evening after receiving antianxiety medication for a pterygium removal in his ophthalmologist office. ? ?Was in the car yesterday, had surgery around 1130PM for eye, removed pterigium, was under conscious sedation, was able to talk, had anxiety medication, in the opthalmologists office, then checked a nhour later things were ok.  ? ?Then around 5 or 6PM was sitting in the car, felt weak, no energy all of a sudden, pulled eyelid then had severe pain, not sure if stitch pulled, was having pain.  Then had syncopal episode, [place] driv[place]rough.  Was feeling lightheaded following the event and felt like couldn't move legs bilaterally, arms, couldn't talk, for 20 minutes,  then when walking was shaky. 1 minute syncopal episode, then was groaning,  Could move but didn't feel coordinated, like moving arms up but they were not coordinated, arms were swinging.  Was able to say no and respond. Could move feet but whole body was shaking, sweating profusely, then slept for 3-4 hours, woke up and ate. ? ?No chest pain, no dyspnea, black or bloody stools, n ohx of syncope in the past except when had covid.  ? ?Took a tylenol for eye pain. ?  ?No hx of reactions to anasthesia ? ?No dm, htn, hlpd ?No fam hx of early heart disease ?No hx of smoking, etohy, other drugs ?Past Medical History:  ?Diagnosis Date  ? Allergy   ? GERD (gastroesophageal reflux disease)   ? Meniere disease   ?  ? ?Home Medications ?Prior to Admission medications   ?Medication Sig Start Date End Date Taking? Authorizing Provider  ?fexofenadine (ALLEGRA) 180 MG tablet Take 180 mg by  mouth daily.    [provider]  ?methylphenidate (RITALIN) 20 MG tablet Take 1 tablet (20 mg total) by mouth daily. 12/17/20   Mayer MaskerAbonza, Maritza, PA-C  ?omeprazole (PRILOSEC) 20 MG capsule TAKE 1 CAPSULE(20 MG) BY MOUTH DAILY 12/17/20   Abonza, Maritza, PA-C  ?ondansetron (ZOFRAN ODT) 4 MG disintegrating tablet Take 1 tablet (4 mg total) by mouth every 8 (eight) hours as needed for nausea or vomiting. 01/07/21   Mayer MaskerAbonza, Maritza, PA-C  ?Oxymetazoline HCl (NASAL SPRAY NA) Place 1 spray into the nose daily.    [provider]  ?sildenafil (REVATIO) 20 MG tablet SMARTSIG:1-5 Tablet(s) By Mouth PRN 08/17/20   [provider]  ?   ? ?Allergies    ?Patient has no known allergies.   ? ?Review of Systems   ?Review of Systems ? ?Physical Exam ?Updated Vital Signs ?BP 107/82   Pulse 73   Temp 97.8 ?F (36.6 ?C) (Oral)   Resp 16   Ht 5\' 9"  (1.753 m)   Wt 83 kg   SpO2 100%   BMI 27.02 kg/m?  ?Physical Exam ?Vitals and nursing note reviewed.  ?Constitutional:   ?   General: He is not in acute distress. ?   Appearance: Normal appearance. He is well-developed. He is not ill-appearing or diaphoretic.  ?HENT:  ?   Head: Normocephalic and atraumatic.  ?Eyes:  ?   General: No visual field deficit. ?  Extraocular Movements: Extraocular movements intact.  ?   Conjunctiva/sclera: Conjunctivae normal.  ?   Pupils: Pupils are equal, round, and reactive to light.  ?   Comments: Right medial eye with conjunctival erythema  ?Cardiovascular:  ?   Rate and Rhythm: Normal rate and regular rhythm.  ?   Pulses: Normal pulses.  ?   Heart sounds: Normal heart sounds. No murmur heard. ?  No friction rub. No gallop.  ?Pulmonary:  ?   Effort: Pulmonary effort is normal. No respiratory distress.  ?   Breath sounds: Normal breath sounds. No wheezing or rales.  ?Abdominal:  ?   General: There is no distension.  ?   Palpations: Abdomen is soft.  ?   Tenderness: There is no abdominal tenderness. There is no guarding.   ?Musculoskeletal:     ?   General: No swelling or tenderness.  ?   Cervical back: Normal range of motion.  ?Skin: ?   General: Skin is warm and dry.  ?   Findings: No erythema or rash.  ?Neurological:  ?   General: No focal deficit present.  ?   Mental Status: He is alert and oriented to person, place, and time.  ?   GCS: GCS eye subscore is 4. GCS verbal subscore is 5. GCS motor subscore is 6.  ?   Cranial Nerves: No cranial nerve deficit, dysarthria or facial asymmetry.  ?   Sensory: No sensory deficit.  ?   Motor: No weakness or tremor.  ?   Coordination: Coordination normal. Finger-Nose-Finger Test normal.  ?   Gait: Gait normal.  ? ? ?ED Results / Procedures / Treatments   ?Labs ?(all labs ordered are listed, but only abnormal results are displayed) ?Labs Reviewed  ?BASIC METABOLIC PANEL - Abnormal; Notable for the following components:  ?    Result Value  ? Glucose, Bld 100 (*)   ? All other components within normal limits  ?CBC WITH DIFFERENTIAL/PLATELET  ?TROPONIN I (HIGH SENSITIVITY)  ? ? ?EKG ?EKG Interpretation ? ?Date/Time:  Thursday June 12 2021 09:20:16 EDT ?Ventricular Rate:  81 ?PR Interval:  147 ?QRS Duration: 100 ?QT Interval:  365 ?QTC Calculation: 424 ?R Axis:   -14 ?Text Interpretation: Sinus rhythm No significant change since last tracing Confirmed by Alvira Monday ([id number]) on 06/12/2021 9:27:09 AM ? ?Radiology ?CT ANGIO HEAD NECK W WO CM ? ?Result Date: 06/12/2021 ?CLINICAL DATA:  Provided history: Syncope/presyncope, cerebrovascular cause suspected. Syncope followed by coordination problems. EXAM: CT ANGIOGRAPHY HEAD AND NECK TECHNIQUE: Multidetector CT imaging of the head and neck was performed using the standard protocol during bolus administration of intravenous contrast. Multiplanar CT image reconstructions and MIPs were obtained to evaluate the vascular anatomy. Carotid stenosis measurements (when applicable) are obtained utilizing NASCET criteria, using the distal internal carotid  diameter as the denominator. RADIATION DOSE REDUCTION: This exam was performed according to the departmental dose-optimization program which includes automated exposure control, adjustment of the mA and/or kV according to patient size and/or use of iterative reconstruction technique. CONTRAST:  OMNIPAQUE IOHEXOL 350 MG/ML SOLN COMPARISON:  Brain MRI 11/27/2011. FINDINGS: CT HEAD FINDINGS Brain: Cerebral volume is normal. There is no acute intracranial hemorrhage. No demarcated cortical infarct. No extra-axial fluid collection. No evidence of an intracranial mass. No midline shift. Vascular: No hyperdense vessel. Skull: Normal. Negative for fracture or focal lesion. Sinuses: Mild to moderate mucosal thickening, and small mucous retention cysts, within the right maxillary sinus. Orbits: No orbital mass or acute  orbital finding. Other: Chronic fracture deformities of the bilateral nasal bones. Review of the MIP images confirms the above findings CTA NECK FINDINGS Aortic arch: Standard aortic branching. Streak and beam hardening artifact arising from a dense right-sided contrast bolus partially obscures the right subclavian artery. Within this limitation, there is no appreciable hemodynamically significant innominate or proximal subclavian artery stenosis. Right carotid system: CCA and ICA patent within the neck without stenosis or significant atherosclerotic disease. Left carotid system: CCA and ICA patent within the neck without stenosis or significant atherosclerotic disease. Vertebral arteries: Vertebral arteries patent within the neck without stenosis or significant atherosclerotic disease. The right vertebral artery is slightly dominant. Skeleton: Cervical spondylosis. No acute bony abnormality or aggressive osseous lesion. Other neck: No neck mass or cervical lymphadenopathy. Upper chest: No consolidation within the imaged lung apices. Review of the MIP images confirms the above findings CTA HEAD FINDINGS  Anterior circulation: The intracranial internal carotid arteries are patent. The M1 middle cerebral arteries are patent. No M2 proximal branch occlusion or high-grade proximal stenosis is identified. The anterior cerebral

## 2021-06-12 NOTE — ED Triage Notes (Signed)
Eye surgery yesterday, rec moderate sedation. Rt eye very red in color. Was in drive through line to obtain lunch and wife states he became very diaphoretic and had "passed out" . Estimated LOC approx 1 min, Very fatigued, tired post LOC. Difficulty speech, slurred speech.  ?

## 2021-07-09 DIAGNOSIS — H11002 Unspecified pterygium of left eye: Secondary | ICD-10-CM | POA: Diagnosis not present

## 2021-07-23 ENCOUNTER — Telehealth: Payer: Self-pay | Admitting: Physician Assistant

## 2021-07-23 NOTE — Telephone Encounter (Signed)
Patient called into office to make lab appointment and I didn't see any labs ordered.  Patient stated this was discussed at last appointment and patient was advised to call insurance to see if they would pay for testosterone level check.  Patient called insurance company and they will cover for labwork.  I copied and pasted what was discussed at last appointment on 01/07/21.  Please advise if OK to schedule lab appointment.    Additional concerns beyond Health Maintenance issues:   Patient has c/o extreme fatigue and having a hard time waking up in the mornings. Reports slight decrease in libido and wondering if possibly testosterone related. Also struggling with chronic diarrhea. States has not been able to identify potential triggers, has tried probiotic which has not helped and has to take imodium frequently.

## 2021-07-29 ENCOUNTER — Ambulatory Visit: Payer: BC Managed Care – PPO | Admitting: Physician Assistant

## 2021-07-29 NOTE — Progress Notes (Deleted)
Established patient visit   Patient: Roger Martin   DOB: 06-24-1965   56 y.o. Male  MRN: [id number] Visit Date: 07/29/2021  No chief complaint on file.  Subjective    HPI  Patient presents for follow-up on medication management.    Medications: Outpatient Medications Prior to Visit  Medication Sig   fexofenadine (ALLEGRA) 180 MG tablet Take 180 mg by mouth daily.   methylphenidate (RITALIN) 20 MG tablet Take 1 tablet (20 mg total) by mouth daily.   omeprazole (PRILOSEC) 20 MG capsule TAKE 1 CAPSULE(20 MG) BY MOUTH DAILY   ondansetron (ZOFRAN ODT) 4 MG disintegrating tablet Take 1 tablet (4 mg total) by mouth every 8 (eight) hours as needed for nausea or vomiting.   Oxymetazoline HCl (NASAL SPRAY NA) Place 1 spray into the nose daily.   sildenafil (REVATIO) 20 MG tablet SMARTSIG:1-5 Tablet(s) By Mouth PRN   No facility-administered medications prior to visit.    Review of Systems Review of Systems:  A fourteen system review of systems was performed and found to be positive as per HPI.  Last CBC Lab Results  Component Value Date   WBC 6.0 06/12/2021   HGB 13.9 06/12/2021   HCT 41.7 06/12/2021   MCV 91.0 06/12/2021   MCH 30.3 06/12/2021   RDW 12.9 06/12/2021   PLT 279 06/12/2021   Last metabolic panel Lab Results  Component Value Date   GLUCOSE 100 (H) 06/12/2021   NA 138 06/12/2021   K 4.4 06/12/2021   CL 105 06/12/2021   CO2 27 06/12/2021   BUN 16 06/12/2021   CREATININE 1.09 06/12/2021   GFRNONAA >60 06/12/2021   CALCIUM 9.1 06/12/2021   PROT 7.1 01/03/2021   ALBUMIN 4.6 01/03/2021   LABGLOB 2.5 01/03/2021   AGRATIO 1.8 01/03/2021   BILITOT 0.9 01/03/2021   ALKPHOS 87 01/03/2021   AST 21 01/03/2021   ALT 21 01/03/2021   ANIONGAP 6 06/12/2021   Last lipids Lab Results  Component Value Date   CHOL 204 (H) 01/03/2021   HDL 54 01/03/2021   LDLCALC 132 (H) 01/03/2021   TRIG 100 01/03/2021   CHOLHDL 3.8 01/03/2021   Last hemoglobin A1c Lab  Results  Component Value Date   HGBA1C 5.4 01/03/2021   Last thyroid functions Lab Results  Component Value Date   TSH 3.070 01/03/2021   T3TOTAL 110 11/22/2018   Last vitamin D No results found for: 25OHVITD2, 25OHVITD3, VD25OH   Objective    There were no vitals taken for this visit. BP Readings from Last 3 Encounters:  06/12/21 107/82  01/07/21 136/84  12/16/20 129/90   Wt Readings from Last 3 Encounters:  06/12/21 183 lb (83 kg)  01/07/21 196 lb (88.9 kg)  12/17/20 196 lb (88.9 kg)    Physical Exam  General:  Well Developed, well nourished, appropriate for stated age.  Neuro:  Alert and oriented,  extra-ocular muscles intact  HEENT:  Normocephalic, atraumatic, neck supple  Skin:  no gross rash, warm, pink. Cardiac:  RRR, S1 S2 Respiratory: CTA B/L  Vascular:  Ext warm, no cyanosis apprec.; cap RF less 2 sec. Psych:  No HI/SI, judgement and insight good, Euthymic mood. Full Affect.   No results found for any visits on 07/29/21.  Assessment & Plan      Problem List Items Addressed This Visit   None Visit Diagnoses     Gastroesophageal reflux disease, unspecified whether esophagitis present    -  Primary  No follow-ups on file.        Lorrene Reid, PA-C  [facility] at [facility] [phone] (phone) [phone] (fax)  [place]

## 2021-08-20 DIAGNOSIS — M9902 Segmental and somatic dysfunction of thoracic region: Secondary | ICD-10-CM | POA: Diagnosis not present

## 2021-08-20 DIAGNOSIS — R42 Dizziness and giddiness: Secondary | ICD-10-CM | POA: Diagnosis not present

## 2021-08-20 DIAGNOSIS — M50323 Other cervical disc degeneration at C6-C7 level: Secondary | ICD-10-CM | POA: Diagnosis not present

## 2021-08-20 DIAGNOSIS — M9907 Segmental and somatic dysfunction of upper extremity: Secondary | ICD-10-CM | POA: Diagnosis not present

## 2021-09-04 DIAGNOSIS — M9902 Segmental and somatic dysfunction of thoracic region: Secondary | ICD-10-CM | POA: Diagnosis not present

## 2021-09-04 DIAGNOSIS — M9907 Segmental and somatic dysfunction of upper extremity: Secondary | ICD-10-CM | POA: Diagnosis not present

## 2021-09-09 DIAGNOSIS — J3081 Allergic rhinitis due to animal (cat) (dog) hair and dander: Secondary | ICD-10-CM | POA: Diagnosis not present

## 2021-09-09 DIAGNOSIS — J301 Allergic rhinitis due to pollen: Secondary | ICD-10-CM | POA: Diagnosis not present

## 2021-09-09 DIAGNOSIS — M9907 Segmental and somatic dysfunction of upper extremity: Secondary | ICD-10-CM | POA: Diagnosis not present

## 2021-09-09 DIAGNOSIS — M9902 Segmental and somatic dysfunction of thoracic region: Secondary | ICD-10-CM | POA: Diagnosis not present

## 2021-09-10 DIAGNOSIS — J3089 Other allergic rhinitis: Secondary | ICD-10-CM | POA: Diagnosis not present

## 2021-09-11 DIAGNOSIS — M9907 Segmental and somatic dysfunction of upper extremity: Secondary | ICD-10-CM | POA: Diagnosis not present

## 2021-09-11 DIAGNOSIS — M9902 Segmental and somatic dysfunction of thoracic region: Secondary | ICD-10-CM | POA: Diagnosis not present

## 2021-09-23 DIAGNOSIS — M9902 Segmental and somatic dysfunction of thoracic region: Secondary | ICD-10-CM | POA: Diagnosis not present

## 2021-09-23 DIAGNOSIS — M9907 Segmental and somatic dysfunction of upper extremity: Secondary | ICD-10-CM | POA: Diagnosis not present

## 2021-09-26 DIAGNOSIS — M9902 Segmental and somatic dysfunction of thoracic region: Secondary | ICD-10-CM | POA: Diagnosis not present

## 2021-09-26 DIAGNOSIS — M9907 Segmental and somatic dysfunction of upper extremity: Secondary | ICD-10-CM | POA: Diagnosis not present

## 2021-09-29 DIAGNOSIS — M9907 Segmental and somatic dysfunction of upper extremity: Secondary | ICD-10-CM | POA: Diagnosis not present

## 2021-09-29 DIAGNOSIS — M9902 Segmental and somatic dysfunction of thoracic region: Secondary | ICD-10-CM | POA: Diagnosis not present

## 2021-10-01 ENCOUNTER — Other Ambulatory Visit: Payer: Self-pay | Admitting: Physician Assistant

## 2021-10-01 DIAGNOSIS — K219 Gastro-esophageal reflux disease without esophagitis: Secondary | ICD-10-CM

## 2021-11-13 ENCOUNTER — Ambulatory Visit (INDEPENDENT_AMBULATORY_CARE_PROVIDER_SITE_OTHER): Payer: BC Managed Care – PPO | Admitting: Nurse Practitioner

## 2021-11-13 ENCOUNTER — Encounter: Payer: Self-pay | Admitting: Nurse Practitioner

## 2021-11-13 VITALS — BP 105/65 | HR 95 | Ht 69.0 in | Wt 193.4 lb

## 2021-11-13 DIAGNOSIS — R5383 Other fatigue: Secondary | ICD-10-CM | POA: Diagnosis not present

## 2021-11-13 DIAGNOSIS — R972 Elevated prostate specific antigen [PSA]: Secondary | ICD-10-CM

## 2021-11-13 DIAGNOSIS — R6882 Decreased libido: Secondary | ICD-10-CM

## 2021-11-13 DIAGNOSIS — Z87898 Personal history of other specified conditions: Secondary | ICD-10-CM | POA: Diagnosis not present

## 2021-11-13 MED ORDER — ONDANSETRON 4 MG PO TBDP: 4.0000 mg | ORAL_TABLET | Freq: Three times a day (TID) | ORAL | 1 refills | Status: DC | PRN

## 2021-11-13 NOTE — Progress Notes (Signed)
Established patient visit   Patient: Roger Martin   DOB: [birth year]   56 y.o. Male  MRN: [id number] Visit Date: 11/13/2021   Chief Complaint  Patient presents with   Fatigue   Subjective    HPI  Has been fatigued for several months  -able to go through activities of daily living. Goes to work, works out.  -did talk to other provider and was told to wait six months and check labs, including testosterone.  -denies headache, chest pain, chest pressure, or dizziness.  -denies nausea, vomiting, or abdominal pain Labs checked in November 2022. Mild elevation of PSA at 4.2   History of meniere;s disease. Takes zofran when he gets acute vertigo. States that this is the only thing that relieves the symptoms. Needs to have a refill for this as his current prescription is expired.    Medications: Outpatient Medications Prior to Visit  Medication Sig   fexofenadine (ALLEGRA) 180 MG tablet Take 180 mg by mouth daily.   methylphenidate (RITALIN) 20 MG tablet Take 1 tablet (20 mg total) by mouth daily.   omeprazole (PRILOSEC) 20 MG capsule TAKE 1 CAPSULE(20 MG) BY MOUTH DAILY   Oxymetazoline HCl (NASAL SPRAY NA) Place 1 spray into the nose daily.   sildenafil (REVATIO) 20 MG tablet SMARTSIG:1-5 Tablet(s) By Mouth PRN   [DISCONTINUED] ondansetron (ZOFRAN ODT) 4 MG disintegrating tablet Take 1 tablet (4 mg total) by mouth every 8 (eight) hours as needed for nausea or vomiting.   No facility-administered medications prior to visit.    Review of Systems  Constitutional:  Positive for fatigue. Negative for activity change, chills and fever.  HENT:  Negative for congestion, postnasal drip, rhinorrhea, sinus pressure, sinus pain, sneezing and sore throat.   Eyes: Negative.   Respiratory:  Negative for cough, shortness of breath and wheezing.   Cardiovascular:  Negative for chest pain and palpitations.  Gastrointestinal:  Negative for constipation, diarrhea, nausea and vomiting.  Endocrine:  Negative for cold intolerance, heat intolerance, polydipsia and polyuria.  Genitourinary:  Negative for dysuria, frequency and urgency.       Decreased libido. Recently had midlly elevated PSA at 4.2   Musculoskeletal:  Negative for back pain and myalgias.  Skin:  Negative for rash.  Allergic/Immunologic: Negative for environmental allergies.  Neurological:  Positive for dizziness. Negative for weakness and headaches.       Histolry of vertigo. Will take zofran as needed which does help.   Psychiatric/Behavioral:  The patient is not nervous/anxious.        Objective     Today's Vitals   11/13/21 1013  BP: 105/65  Pulse: 95  SpO2: 95%  Weight: 193 lb 6.4 oz (87.7 kg)  Height: '5\' 9"'  (1.753 m)   Body mass index is 28.56 kg/m.   BP Readings from Last 3 Encounters:  11/13/21 105/65  06/12/21 107/82  01/07/21 136/84    Wt Readings from Last 3 Encounters:  11/13/21 193 lb 6.4 oz (87.7 kg)  06/12/21 183 lb (83 kg)  01/07/21 196 lb (88.9 kg)    Physical Exam Vitals and nursing note reviewed.  Constitutional:      Appearance: Normal appearance. He is well-developed.  HENT:     Head: Normocephalic and atraumatic.     Nose: Nose normal.     Mouth/Throat:     Mouth: Mucous membranes are moist.     Pharynx: Oropharynx is clear.  Eyes:     Extraocular Movements: Extraocular movements intact.  Conjunctiva/sclera: Conjunctivae normal.     Pupils: Pupils are equal, round, and reactive to light.  Cardiovascular:     Rate and Rhythm: Normal rate and regular rhythm.     Pulses: Normal pulses.     Heart sounds: Normal heart sounds.  Pulmonary:     Effort: Pulmonary effort is normal.     Breath sounds: Normal breath sounds.  Abdominal:     Palpations: Abdomen is soft.  Musculoskeletal:        General: Normal range of motion.     Cervical back: Normal range of motion and neck supple.  Lymphadenopathy:     Cervical: No cervical adenopathy.  Skin:    General: Skin is warm  and dry.     Capillary Refill: Capillary refill takes less than 2 seconds.  Neurological:     General: No focal deficit present.     Mental Status: He is alert and oriented to person, place, and time.  Psychiatric:        Mood and Affect: Mood normal.        Behavior: Behavior normal.        Thought Content: Thought content normal.        Judgment: Judgment normal.    Results for orders placed or performed in visit on 11/13/21  CBC  Result Value Ref Range   WBC 4.9 3.4 - 10.8 x10E3/uL   RBC 4.58 4.14 - 5.80 x10E6/uL   Hemoglobin 14.5 13.0 - 17.7 g/dL   Hematocrit 42.0 37.5 - 51.0 %   MCV 92 79 - 97 fL   MCH 31.7 26.6 - 33.0 pg   MCHC 34.5 31.5 - 35.7 g/dL   RDW 12.7 11.6 - 15.4 %   Platelets 242 150 - 450 x10E3/uL  Testosterone  Result Value Ref Range   Testosterone 432 264 - 916 ng/dL  PSA  Result Value Ref Range   Prostate Specific Ag, Serum 3.2 0.0 - 4.0 ng/mL  Ferritin  Result Value Ref Range   Ferritin 271 30 - 400 ng/mL  Comp Met (CMET)  Result Value Ref Range   Glucose 89 70 - 99 mg/dL   BUN 18 6 - 24 mg/dL   Creatinine, Ser 1.10 0.76 - 1.27 mg/dL   eGFR 79 >59 mL/min/1.73   BUN/Creatinine Ratio 16 9 - 20   Sodium 140 134 - 144 mmol/L   Potassium 4.3 3.5 - 5.2 mmol/L   Chloride 102 96 - 106 mmol/L   CO2 24 20 - 29 mmol/L   Calcium 9.3 8.7 - 10.2 mg/dL   Total Protein 6.9 6.0 - 8.5 g/dL   Albumin 4.6 3.8 - 4.9 g/dL   Globulin, Total 2.3 1.5 - 4.5 g/dL   Albumin/Globulin Ratio 2.0 1.2 - 2.2   Bilirubin Total 0.6 0.0 - 1.2 mg/dL   Alkaline Phosphatase 93 44 - 121 IU/L   AST 21 0 - 40 IU/L   ALT 21 0 - 44 IU/L  B12 and Folate Panel  Result Value Ref Range   Vitamin B-12 468 232 - 1,245 pg/mL   Folate 13.7 >3.0 ng/mL    Assessment & Plan    1. Other fatigue Check CBC, iron panel, and serum testosterone for further evaluation. - CBC - Testosterone - Ferritin - Comp Met (CMET) - B12 and Folate Panel  2. Decreased libido Check testosterone and PSA  levels for further evaluation. - Testosterone - PSA  3. History of vertigo Stable milligram in the past to help with episodes  of vertigo.  New prescription sent to his pharmacy today. - ondansetron (ZOFRAN ODT) 4 MG disintegrating tablet; Take 1 tablet (4 mg total) by mouth every 8 (eight) hours as needed for nausea or vomiting.  Dispense: 30 tablet; Refill: 1  4. Elevated PSA Check new PSA level for further evaluation.  Refer to urology as needed. - PSA  Problem List Items Addressed This Visit       Other   History of vertigo (Chronic)   Relevant Medications   ondansetron (ZOFRAN ODT) 4 MG disintegrating tablet   Other fatigue - Primary   Relevant Orders   CBC (Completed)   Testosterone (Completed)   Ferritin (Completed)   Comp Met (CMET) (Completed)   B12 and Folate Panel (Completed)   Decreased libido   Relevant Orders   Testosterone (Completed)   PSA (Completed)   Elevated PSA   Relevant Orders   PSA (Completed)     Return in about 2 months (around 01/13/2022) for health maintenance exam - he generally sees [place].         Ronnell Freshwater, NP  [facility] [phone] (phone) [phone] (fax)  [place]

## 2021-11-14 LAB — COMPREHENSIVE METABOLIC PANEL
ALT: 21 IU/L (ref 0–44)
AST: 21 IU/L (ref 0–40)
Albumin/Globulin Ratio: 2 (ref 1.2–2.2)
Albumin: 4.6 g/dL (ref 3.8–4.9)
Alkaline Phosphatase: 93 IU/L (ref 44–121)
BUN/Creatinine Ratio: 16 (ref 9–20)
BUN: 18 mg/dL (ref 6–24)
Bilirubin Total: 0.6 mg/dL (ref 0.0–1.2)
CO2: 24 mmol/L (ref 20–29)
Calcium: 9.3 mg/dL (ref 8.7–10.2)
Chloride: 102 mmol/L (ref 96–106)
Creatinine, Ser: 1.1 mg/dL (ref 0.76–1.27)
Globulin, Total: 2.3 g/dL (ref 1.5–4.5)
Glucose: 89 mg/dL (ref 70–99)
Potassium: 4.3 mmol/L (ref 3.5–5.2)
Sodium: 140 mmol/L (ref 134–144)
Total Protein: 6.9 g/dL (ref 6.0–8.5)
eGFR: 79 mL/min/{1.73_m2} (ref >59)

## 2021-11-14 LAB — CBC
Hematocrit: 42 % (ref 37.5–51.0)
Hemoglobin: 14.5 g/dL (ref 13.0–17.7)
MCH: 31.7 pg (ref 26.6–33.0)
MCHC: 34.5 g/dL (ref 31.5–35.7)
MCV: 92 fL (ref 79–97)
Platelets: 242 10*3/uL (ref 150–450)
RBC: 4.58 x10E6/uL (ref 4.14–5.80)
RDW: 12.7 % (ref 11.6–15.4)
WBC: 4.9 10*3/uL (ref 3.4–10.8)

## 2021-11-14 LAB — FERRITIN: Ferritin: 271 ng/mL (ref 30–400)

## 2021-11-14 LAB — TESTOSTERONE: Testosterone: 432 ng/dL (ref 264–916)

## 2021-11-14 LAB — B12 AND FOLATE PANEL
Folate: 13.7 ng/mL (ref >3.0)
Vitamin B-12: 468 pg/mL (ref 232–1245)

## 2021-11-14 LAB — PSA: Prostate Specific Ag, Serum: 3.2 ng/mL (ref 0.0–4.0)

## 2021-11-14 NOTE — Progress Notes (Signed)
Please let the patient know that all of hs labs, including the testosterone levels were all normal. Thanks so much.   -HB

## 2021-11-30 DIAGNOSIS — R6882 Decreased libido: Secondary | ICD-10-CM | POA: Insufficient documentation

## 2021-11-30 DIAGNOSIS — R972 Elevated prostate specific antigen [PSA]: Secondary | ICD-10-CM | POA: Insufficient documentation

## 2021-11-30 DIAGNOSIS — R5383 Other fatigue: Secondary | ICD-10-CM | POA: Insufficient documentation

## 2021-12-19 ENCOUNTER — Encounter (HOSPITAL_BASED_OUTPATIENT_CLINIC_OR_DEPARTMENT_OTHER): Payer: Self-pay | Admitting: Emergency Medicine

## 2021-12-19 ENCOUNTER — Emergency Department (HOSPITAL_BASED_OUTPATIENT_CLINIC_OR_DEPARTMENT_OTHER): Payer: BC Managed Care – PPO

## 2021-12-19 ENCOUNTER — Other Ambulatory Visit: Payer: Self-pay

## 2021-12-19 ENCOUNTER — Emergency Department (HOSPITAL_BASED_OUTPATIENT_CLINIC_OR_DEPARTMENT_OTHER)
Admission: EM | Admit: 2021-12-19 | Discharge: 2021-12-19 | Disposition: A | Discharge status: Home / Self Care | Payer: BC Managed Care – PPO | Attending: Emergency Medicine | Admitting: Emergency Medicine

## 2021-12-19 DIAGNOSIS — R1111 Vomiting without nausea: Secondary | ICD-10-CM | POA: Diagnosis not present

## 2021-12-19 DIAGNOSIS — H8109 Meniere's disease, unspecified ear: Secondary | ICD-10-CM | POA: Insufficient documentation

## 2021-12-19 DIAGNOSIS — R42 Dizziness and giddiness: Secondary | ICD-10-CM | POA: Diagnosis not present

## 2021-12-19 DIAGNOSIS — I10 Essential (primary) hypertension: Secondary | ICD-10-CM | POA: Diagnosis not present

## 2021-12-19 MED ORDER — MECLIZINE HCL 25 MG PO TABS
25.0000 mg | ORAL_TABLET | Freq: Once | ORAL | Status: AC
  Administered 2021-12-19: 25 mg via ORAL
  Filled 2021-12-19: qty 1

## 2021-12-19 MED ORDER — ONDANSETRON 8 MG PO TBDP: ORAL_TABLET | ORAL | 0 refills | Status: DC

## 2021-12-19 MED ORDER — MECLIZINE HCL 12.5 MG PO TABS: 12.5000 mg | ORAL_TABLET | Freq: Three times a day (TID) | ORAL | 0 refills | Status: DC | PRN

## 2021-12-19 MED ORDER — SODIUM CHLORIDE 0.9 % IV BOLUS
500.0000 mL | Freq: Once | INTRAVENOUS | Status: AC
Start: 2021-12-19 — End: 2021-12-19
  Administered 2021-12-19: 500 mL via INTRAVENOUS

## 2021-12-19 MED ORDER — HALOPERIDOL LACTATE 5 MG/ML IJ SOLN
2.0000 mg | Freq: Once | INTRAMUSCULAR | Status: AC
  Administered 2021-12-19: 2 mg via INTRAVENOUS
  Filled 2021-12-19: qty 1

## 2021-12-19 NOTE — ED Provider Notes (Signed)
[place] EMERGENCY DEPARTMENT Provider Note   CSN: [id number] Arrival date & time: 12/19/21  0241     History  Chief Complaint  Patient presents with   Dizziness    Roger Martin is a 56 y.o. male.  The history is provided by the patient.  Dizziness Quality:  Head spinning Severity:  Severe Onset quality:  Sudden Duration:  2 hours Timing:  Constant Progression:  Waxing and waning Chronicity:  Recurrent Relieved by:  Nothing Worsened by:  Nothing Ineffective treatments:  None tried Associated symptoms: no headaches, no hearing loss, no shortness of breath, no syncope and no weakness   Risk factors: hx of vertigo and Meniere's disease        Home Medications Prior to Admission medications   Medication Sig Start Date End Date Taking? Authorizing Provider  meclizine (ANTIVERT) 12.5 MG tablet Take 1 tablet (12.5 mg total) by mouth 3 (three) times daily as needed for dizziness. 12/19/21  Yes Clements Toro, MD  ondansetron (ZOFRAN-ODT) 8 MG disintegrating tablet 8mg  ODT q8 hours prn nausea 12/19/21  Yes Syanne Looney, MD  fexofenadine (ALLEGRA) 180 MG tablet Take 180 mg by mouth daily.    [provider]  methylphenidate (RITALIN) 20 MG tablet Take 1 tablet (20 mg total) by mouth daily. 12/17/20   Lorrene Reid, PA-C  omeprazole (PRILOSEC) 20 MG capsule TAKE 1 CAPSULE(20 MG) BY MOUTH DAILY 10/01/21   Abonza, Maritza, PA-C  ondansetron (ZOFRAN ODT) 4 MG disintegrating tablet Take 1 tablet (4 mg total) by mouth every 8 (eight) hours as needed for nausea or vomiting. 11/13/21   Ronnell Freshwater, NP  Oxymetazoline HCl (NASAL SPRAY NA) Place 1 spray into the nose daily.    [provider]  sildenafil (REVATIO) 20 MG tablet SMARTSIG:1-5 Tablet(s) By Mouth PRN 08/17/20   [provider]      Allergies    Patient has no known allergies.    Review of Systems   Review of Systems  Constitutional:  Negative for fever.  HENT:  Negative for  hearing loss.   Respiratory:  Negative for shortness of breath.   Cardiovascular:  Negative for syncope.  Neurological:  Positive for dizziness. Negative for seizures, facial asymmetry, speech difficulty, weakness and headaches.  All other systems reviewed and are negative.   Physical Exam Updated Vital Signs BP 128/84 (BP Location: Right Arm)   Pulse 84   Temp 98.1 F (36.7 C) (Oral)   Resp 18   Ht 5\' 9"  (1.753 m)   Wt 87.1 kg   SpO2 99%   BMI 28.35 kg/m  Physical Exam Vitals and nursing note reviewed.  Constitutional:      General: He is not in acute distress.    Appearance: He is well-developed. He is not diaphoretic.  HENT:     Head: Normocephalic and atraumatic.     Right Ear: Tympanic membrane normal.     Left Ear: Tympanic membrane normal.     Nose: Nose normal.     Mouth/Throat:     Mouth: Mucous membranes are moist.  Eyes:     Conjunctiva/sclera: Conjunctivae normal.     Pupils: Pupils are equal, round, and reactive to light.  Cardiovascular:     Rate and Rhythm: Normal rate and regular rhythm.     Pulses: Normal pulses.     Heart sounds: Normal heart sounds.  Pulmonary:     Effort: Pulmonary effort is normal.     Breath sounds: Normal breath sounds.  No wheezing or rales.  Abdominal:     General: Bowel sounds are normal.     Palpations: Abdomen is soft.     Tenderness: There is no abdominal tenderness. There is no guarding or rebound.  Musculoskeletal:        General: Normal range of motion.     Cervical back: Normal range of motion and neck supple.  Skin:    General: Skin is warm and dry.  Neurological:     Mental Status: He is alert and oriented to person, place, and time.     Cranial Nerves: No cranial nerve deficit.  Psychiatric:        Mood and Affect: Mood normal.        Behavior: Behavior normal.     ED Results / Procedures / Treatments   Labs (all labs ordered are listed, but only abnormal results are displayed) Labs Reviewed - No data to  display  EKG None  Radiology CT Head Wo Contrast  Result Date: 12/19/2021 CLINICAL DATA:  Dizziness and nausea.  History of Meniere's disease EXAM: CT HEAD WITHOUT CONTRAST TECHNIQUE: Contiguous axial images were obtained from the base of the skull through the vertex without intravenous contrast. RADIATION DOSE REDUCTION: This exam was performed according to the departmental dose-optimization program which includes automated exposure control, adjustment of the mA and/or kV according to patient size and/or use of iterative reconstruction technique. COMPARISON:  06/12/2021 FINDINGS: Brain: No evidence of acute infarction, hemorrhage, hydrocephalus, extra-axial collection or mass lesion/mass effect. Vascular: No hyperdense vessel or unexpected calcification. Skull: Normal. Negative for fracture or focal lesion. Sinuses/Orbits: No acute finding. IMPRESSION: Stable, negative head CT. Electronically Signed   By: Tiburcio Pea M.D.   On: 12/19/2021 05:11    Procedures Procedures    Medications Ordered in ED Medications  meclizine (ANTIVERT) tablet 25 mg (25 mg Oral Given 12/19/21 0357)  sodium chloride 0.9 % bolus 500 mL ( Intravenous Stopped 12/19/21 0432)  haloperidol lactate (HALDOL) injection 2 mg (2 mg Intravenous Given 12/19/21 0357)  meclizine (ANTIVERT) tablet 25 mg (25 mg Oral Given 12/19/21 0543)    ED Course/ Medical Decision Making/ A&P                           Medical Decision Making Patient with Meniere's with vertigo.  Not on meclizine at home for this   Amount and/or Complexity of Data Reviewed Independent Historian: spouse    Details: See above  External Data Reviewed: notes.    Details: Previous notes reviewed  Radiology: ordered and independent interpretation performed.    Details: Negative CT by me   Risk Prescription drug management. Risk Details: Symptoms consistent with peripheral vertigo.  Patient has known meniere's but is not on meclzine and has not see an  ENT in many years.  Start meclizine and follow up with ENT for ongoing care.      Final Clinical Impression(s) / ED Diagnoses Final diagnoses:  Vertigo  Meniere's disease, unspecified laterality   Return for intractable cough, coughing up blood, fevers > 100.4 unrelieved by medication, shortness of breath, intractable vomiting, chest pain, shortness of breath, weakness, numbness, changes in speech, facial asymmetry, abdominal pain, passing out, Inability to tolerate liquids or food, cough, altered mental status or any concerns. No signs of systemic illness or infection. The patient is nontoxic-appearing on exam and vital signs are within normal limits.  I have reviewed the triage vital signs and the nursing  notes. Pertinent labs & imaging results that were available during my care of the patient were reviewed by me and considered in my medical decision making (see chart for details). After history, exam, and medical workup I feel the patient has been appropriately medically screened and is safe for discharge home. Pertinent diagnoses were discussed with the patient. Patient was given return precautions.    Rx / DC Orders ED Discharge Orders          Ordered    meclizine (ANTIVERT) 12.5 MG tablet  3 times daily PRN        12/19/21 0530    ondansetron (ZOFRAN-ODT) 8 MG disintegrating tablet        12/19/21 0530              Catlyn Shipton, MD 12/19/21 1157

## 2021-12-19 NOTE — ED Triage Notes (Signed)
Patient arrived via EMS c/o vertigo x 1.5 hrs. Patient states taking home phenagryn, with no relief. Patient given 558ml Bolus by EMS pta w/ 4 mg of zofran. Patient states continued dizziness and nausea. Patient is AO x 4, VS WDL, unable to stand at this time.

## 2021-12-19 NOTE — ED Notes (Signed)
Patient transported to CT 

## 2022-01-14 ENCOUNTER — Encounter: Payer: BC Managed Care – PPO | Admitting: Physician Assistant

## 2022-04-15 ENCOUNTER — Telehealth: Payer: Self-pay

## 2022-04-15 DIAGNOSIS — K219 Gastro-esophageal reflux disease without esophagitis: Secondary | ICD-10-CM

## 2022-04-15 MED ORDER — OMEPRAZOLE 20 MG PO CPDR: DELAYED_RELEASE_CAPSULE | ORAL | 0 refills | Status: DC

## 2022-04-15 NOTE — Telephone Encounter (Signed)
30 day sent

## 2022-04-15 NOTE — Telephone Encounter (Signed)
Pt scheduled for 05/21/22

## 2022-04-15 NOTE — Telephone Encounter (Signed)
L.O.V: 11/13/21   N.O.V: Not scheduled  L.R.F: 10/01/21 Omeprazole 90 cap 1 refill   Refill denied. OV Required.

## 2022-04-15 NOTE — Addendum Note (Signed)
Addended by: Gemma Payor on: 04/15/2022 01:44 PM   Modules accepted: Orders

## 2022-04-20 MED ORDER — OMEPRAZOLE 20 MG PO CPDR: DELAYED_RELEASE_CAPSULE | ORAL | 0 refills | Status: DC

## 2022-04-20 NOTE — Addendum Note (Signed)
Addended by: Gemma Payor on: 04/20/2022 01:17 PM   Modules accepted: Orders

## 2022-05-13 DIAGNOSIS — J3089 Other allergic rhinitis: Secondary | ICD-10-CM | POA: Diagnosis not present

## 2022-05-13 DIAGNOSIS — J301 Allergic rhinitis due to pollen: Secondary | ICD-10-CM | POA: Diagnosis not present

## 2022-05-13 DIAGNOSIS — N486 Induration penis plastica: Secondary | ICD-10-CM | POA: Diagnosis not present

## 2022-05-13 DIAGNOSIS — H1045 Other chronic allergic conjunctivitis: Secondary | ICD-10-CM | POA: Diagnosis not present

## 2022-05-13 DIAGNOSIS — J3081 Allergic rhinitis due to animal (cat) (dog) hair and dander: Secondary | ICD-10-CM | POA: Diagnosis not present

## 2022-05-13 DIAGNOSIS — N5201 Erectile dysfunction due to arterial insufficiency: Secondary | ICD-10-CM | POA: Diagnosis not present

## 2022-05-21 ENCOUNTER — Encounter: Payer: BC Managed Care – PPO | Admitting: Family Medicine
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
## 2022-05-22 DIAGNOSIS — J3089 Other allergic rhinitis: Secondary | ICD-10-CM | POA: Diagnosis not present

## 2022-05-22 DIAGNOSIS — J301 Allergic rhinitis due to pollen: Secondary | ICD-10-CM | POA: Diagnosis not present

## 2022-06-05 ENCOUNTER — Other Ambulatory Visit: Payer: Self-pay | Admitting: Family Medicine

## 2022-06-05 DIAGNOSIS — K219 Gastro-esophageal reflux disease without esophagitis: Secondary | ICD-10-CM

## 2022-07-11 DIAGNOSIS — H9203 Otalgia, bilateral: Secondary | ICD-10-CM | POA: Diagnosis not present

## 2022-07-11 DIAGNOSIS — R070 Pain in throat: Secondary | ICD-10-CM | POA: Diagnosis not present

## 2022-07-11 DIAGNOSIS — R051 Acute cough: Secondary | ICD-10-CM | POA: Diagnosis not present

## 2022-07-11 DIAGNOSIS — J019 Acute sinusitis, unspecified: Secondary | ICD-10-CM | POA: Diagnosis not present

## 2023-09-07 ENCOUNTER — Ambulatory Visit: Payer: Self-pay

## 2023-09-07 NOTE — Telephone Encounter (Signed)
 FYI Only or Action Required?: FYI only for provider.  Patient was last seen in primary care on 11/13/2021 by Hanford Powell BRAVO, NP.  Called Nurse Triage reporting Fatigue, Toe Pain, and Urinary Frequency.  Symptoms began several weeks ago.  Interventions attempted: Rest, hydration, or home remedies.  Symptoms are: unchanged.  Triage Disposition: See Physician Within 24 Hours  Patient/caregiver understands and will follow disposition?:     Copied from CRM [id number]. Topic: Clinical - Red Word Triage >> Sep 07, 2023  2:43 PM Marissa P wrote: Red Word that prompted transfer to Nurse Triage: Patient called he is having no energy, feeling weak pretty much everyday. Also, weird feeling in right toe pain. But days went by and nothing is there. Currently no pain and using restroom alot.Been going on for about 5 weeks. Reason for Disposition  Urinating more frequently than usual (i.e., frequency)  Answer Assessment - Initial Assessment Questions 1. SYMPTOM: What's the main symptom you're concerned about? (e.g., frequency, incontinence)     frequency 2. ONSET: When did the  frequency  start?     5 weeks 3. PAIN: Is there any pain? If Yes, ask: How bad is it? (Scale: 1-10; mild, moderate, severe)     denies 4. CAUSE: What do you think is causing the symptoms?     unsure 5. OTHER SYMPTOMS: Do you have any other symptoms? (e.g., blood in urine, fever, flank pain, pain with urination)     Denies  Additional info: Patient pcp has left practice location, other providers do not have availability until September, patient will proceed to urgent care.  Protocols used: Urinary Symptoms-A-AH

## 2023-09-07 NOTE — Telephone Encounter (Signed)
 FYI Only or Action Required?: Action required by provider: request for appointment.  Patient was last seen in primary care on 11/13/2021 by Hanford Powell BRAVO, NP.  Called Nurse Triage reporting Fatigue.  Symptoms began about a month ago.  Interventions attempted: Rest, hydration, or home remedies.  Symptoms are: gradually worsening.  Triage Disposition: See PCP When Office is Open (Within 3 Days)  Patient/caregiver understands and will follow disposition?: YesCopied from CRM [id number]. Topic: Clinical - Red Word Triage >> Sep 07, 2023  2:59 PM Fonda T wrote: [facility] that prompted transfer to Nurse Triage: Patient calling with Increased fatigue, and unable to hold urine, with frequency Reason for Disposition  [1] MILD weakness (i.e., does not interfere with ability to work, go to school, normal activities) AND [2] persists > 1 week  Answer Assessment - Initial Assessment Questions 1. DESCRIPTION: Describe how you are feeling.     Completely drained by end of day  2. SEVERITY: How bad is it?  Can you stand and walk?   - MILD (0-3): Feels weak or tired, but does not interfere with work, school or normal activities.   - MODERATE (4-7): Able to stand and walk; weakness interferes with work, school, or normal activities.   - SEVERE (8-10): Unable to stand or walk; unable to do usual activities.     Mild  3. ONSET: When did these symptoms begin? (e.g., hours, days, weeks, months)     5 weeks ago  4. CAUSE: What do you think is causing the weakness or fatigue? (e.g., not drinking enough fluids, medical problem, trouble sleeping)     Not sure 5. NEW MEDICINES:  Have you started on any new medicines recently? (e.g., opioid pain medicines, benzodiazepines, muscle relaxants, antidepressants, antihistamines, neuroleptics, beta blockers)     denies 6. OTHER SYMPTOMS: Do you have any other symptoms? (e.g., chest pain, fever, cough, SOB, vomiting, diarrhea, bleeding, other areas of  pain)     Increased in urination   No gym in 4 weeks. Pt states it has progressively worsened over last month. Pt has also noticed increase need to urinate. Pt is trying to establish new care at [facility].  Protocols used: Weakness (Generalized) and Fatigue-A-AH

## 2023-09-09 ENCOUNTER — Ambulatory Visit: Admitting: Physician Assistant

## 2023-09-09 VITALS — BP 116/78 | HR 88 | Resp 16 | Ht 69.0 in | Wt 187.0 lb

## 2023-09-09 DIAGNOSIS — Z7689 Persons encountering health services in other specified circumstances: Secondary | ICD-10-CM

## 2023-09-09 DIAGNOSIS — Z1159 Encounter for screening for other viral diseases: Secondary | ICD-10-CM

## 2023-09-09 DIAGNOSIS — Z87898 Personal history of other specified conditions: Secondary | ICD-10-CM

## 2023-09-09 DIAGNOSIS — E038 Other specified hypothyroidism: Secondary | ICD-10-CM

## 2023-09-09 DIAGNOSIS — R350 Frequency of micturition: Secondary | ICD-10-CM

## 2023-09-09 DIAGNOSIS — R5383 Other fatigue: Secondary | ICD-10-CM

## 2023-09-09 DIAGNOSIS — R6882 Decreased libido: Secondary | ICD-10-CM

## 2023-09-09 DIAGNOSIS — Z114 Encounter for screening for human immunodeficiency virus [HIV]: Secondary | ICD-10-CM

## 2023-09-09 DIAGNOSIS — Z125 Encounter for screening for malignant neoplasm of prostate: Secondary | ICD-10-CM

## 2023-09-09 DIAGNOSIS — H8102 Meniere's disease, left ear: Secondary | ICD-10-CM

## 2023-09-09 MED ORDER — MECLIZINE HCL 12.5 MG PO TABS: 12.5000 mg | ORAL_TABLET | Freq: Three times a day (TID) | ORAL | 0 refills | Status: DC | PRN

## 2023-09-09 MED ORDER — ONDANSETRON 4 MG PO TBDP: 4.0000 mg | ORAL_TABLET | Freq: Three times a day (TID) | ORAL | 1 refills | Status: AC | PRN
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
## 2023-09-10 NOTE — Telephone Encounter (Signed)
 Looks like pt established care at another office yesterday.

## 2023-09-11 ENCOUNTER — Encounter: Payer: Self-pay | Admitting: Physician Assistant

## 2023-09-11 DIAGNOSIS — Z87898 Personal history of other specified conditions: Secondary | ICD-10-CM | POA: Insufficient documentation

## 2023-09-11 DIAGNOSIS — R5383 Other fatigue: Secondary | ICD-10-CM | POA: Insufficient documentation

## 2023-09-11 DIAGNOSIS — R350 Frequency of micturition: Secondary | ICD-10-CM | POA: Insufficient documentation

## 2023-09-13 DIAGNOSIS — R5383 Other fatigue: Secondary | ICD-10-CM | POA: Diagnosis not present

## 2023-09-14 ENCOUNTER — Ambulatory Visit: Payer: Self-pay | Admitting: Physician Assistant

## 2023-09-14 LAB — COMPREHENSIVE METABOLIC PANEL WITH GFR
ALT: 14 IU/L (ref 0–44)
AST: 16 IU/L (ref 0–40)
Albumin: 4.4 g/dL (ref 3.8–4.9)
Alkaline Phosphatase: 79 IU/L (ref 44–121)
BUN/Creatinine Ratio: 14 (ref 9–20)
BUN: 14 mg/dL (ref 6–24)
Bilirubin Total: 0.6 mg/dL (ref 0.0–1.2)
CO2: 24 mmol/L (ref 20–29)
Calcium: 9 mg/dL (ref 8.7–10.2)
Chloride: 105 mmol/L (ref 96–106)
Creatinine, Ser: 1.03 mg/dL (ref 0.76–1.27)
Globulin, Total: 2 g/dL (ref 1.5–4.5)
Glucose: 93 mg/dL (ref 70–99)
Potassium: 4.2 mmol/L (ref 3.5–5.2)
Sodium: 141 mmol/L (ref 134–144)
Total Protein: 6.4 g/dL (ref 6.0–8.5)
eGFR: 85 mL/min/1.73 (ref >59)

## 2023-09-14 LAB — CBC WITH DIFFERENTIAL/PLATELET
Basophils Absolute: 0 x10E3/uL (ref 0.0–0.2)
Basos: 1 %
EOS (ABSOLUTE): 0.1 x10E3/uL (ref 0.0–0.4)
Eos: 2 %
Hematocrit: 41.4 % (ref 37.5–51.0)
Hemoglobin: 13.4 g/dL (ref 13.0–17.7)
Immature Grans (Abs): 0 x10E3/uL (ref 0.0–0.1)
Immature Granulocytes: 0 %
Lymphocytes Absolute: 1.1 x10E3/uL (ref 0.7–3.1)
Lymphs: 26 %
MCH: 31.5 pg (ref 26.6–33.0)
MCHC: 32.4 g/dL (ref 31.5–35.7)
MCV: 97 fL (ref 79–97)
Monocytes Absolute: 0.5 x10E3/uL (ref 0.1–0.9)
Monocytes: 11 %
Neutrophils Absolute: 2.6 x10E3/uL (ref 1.4–7.0)
Neutrophils: 60 %
Platelets: 207 x10E3/uL (ref 150–450)
RBC: 4.26 x10E6/uL (ref 4.14–5.80)
RDW: 12.9 % (ref 11.6–15.4)
WBC: 4.4 x10E3/uL (ref 3.4–10.8)

## 2023-09-14 LAB — IRON,TIBC AND FERRITIN PANEL
Ferritin: 245 ng/mL (ref 30–400)
Iron Saturation: 38 % (ref 15–55)
Iron: 95 ug/dL (ref 38–169)
Total Iron Binding Capacity: 249 ug/dL — ABNORMAL LOW (ref 250–450)
UIBC: 154 ug/dL (ref 111–343)

## 2023-09-14 LAB — TSH: TSH: 3.66 u[IU]/mL (ref 0.450–4.500)

## 2023-09-14 LAB — HEMOGLOBIN A1C
Est. average glucose Bld gHb Est-mCnc: 100 mg/dL
Hgb A1c MFr Bld: 5.1 % (ref 4.8–5.6)

## 2023-09-14 LAB — VITAMIN B12: Vitamin B-12: 526 pg/mL (ref 232–1245)

## 2023-09-14 LAB — VITAMIN D 25 HYDROXY (VIT D DEFICIENCY, FRACTURES): Vit D, 25-Hydroxy: 23.1 ng/mL — ABNORMAL LOW (ref 30.0–100.0)

## 2023-10-03 IMAGING — CT CT ANGIO HEAD-NECK (W OR W/O PERF)
1 of 11 series · 5 of 33 positions shown · non-contrast
Comparison: Brain MRI 11/27/2011.

CLINICAL DATA: Provided history: Syncope/presyncope,
cerebrovascular cause suspected. Syncope followed by coordination
problems.

EXAM:
CT ANGIOGRAPHY HEAD AND NECK
TECHNIQUE: Multidetector CT imaging of the head and neck was performed using
the standard protocol during bolus administration of intravenous
contrast. Multiplanar CT image reconstructions and MIPs were
obtained to evaluate the vascular anatomy. Carotid stenosis
measurements (when applicable) are obtained utilizing NASCET
criteria, using the distal internal carotid diameter as the
denominator.

[Series 12: axial thin · axial · 0.45mm/px · z∈[-321,-72]mm · 5 of 375 slices shown]
[im 63/375  soft-tissue]
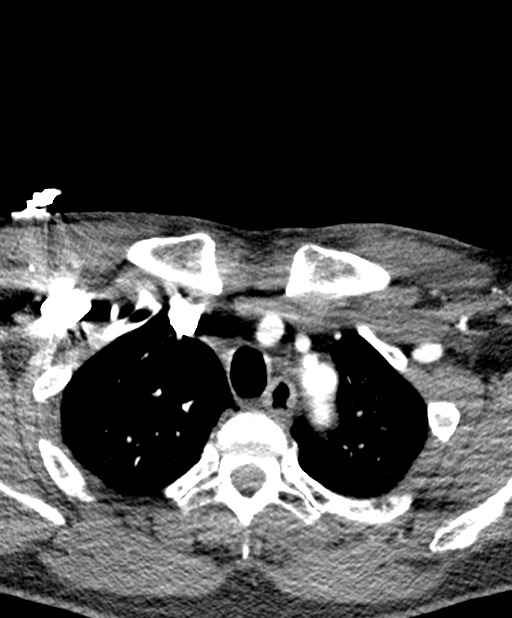
[im 125/375  bone]
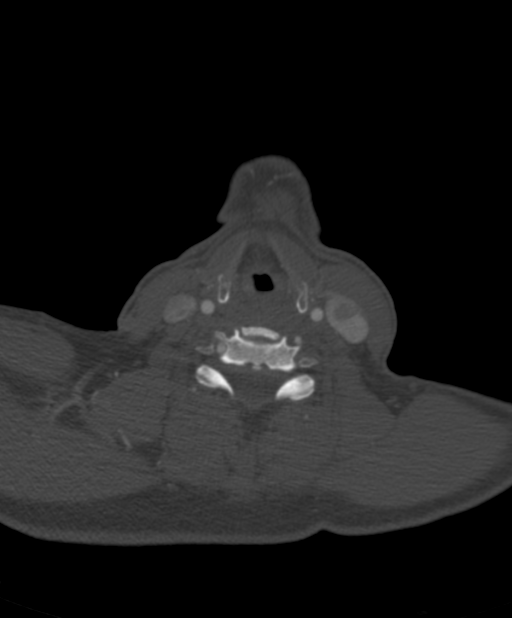
[im 188/375  soft-tissue]
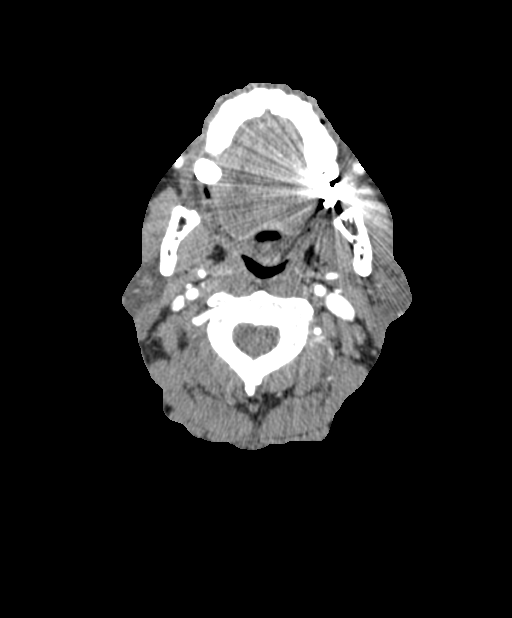
[im 250/375  bone]
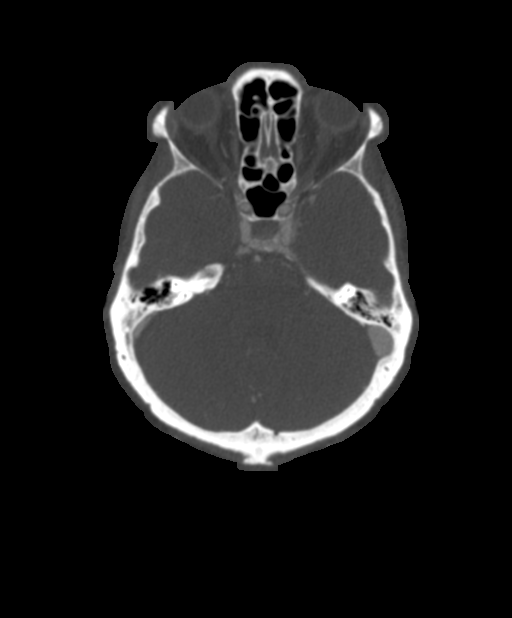
[im 312/375  soft-tissue]
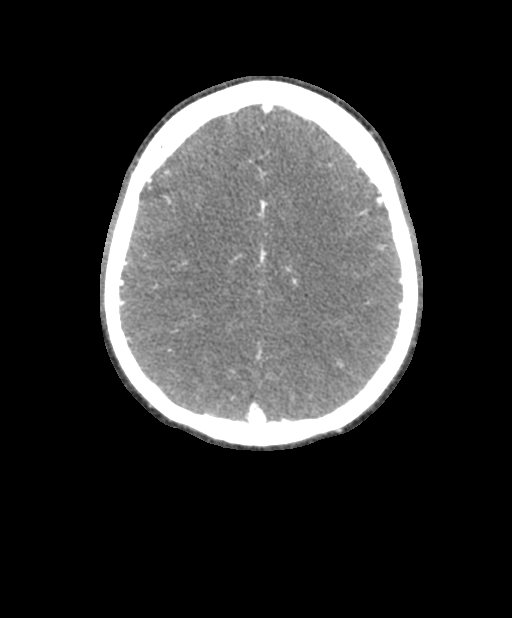

[5 of 33 positions shown; findings below may reference images not displayed]

RADIATION DOSE REDUCTION: This exam was performed according to the
departmental dose-optimization program which includes automated
exposure control, adjustment of the mA and/or kV according to
patient size and/or use of iterative reconstruction technique.

CONTRAST:  100mL OMNIPAQUE IOHEXOL 350 MG/ML SOLN
FINDINGS: CT HEAD FINDINGS

Brain:

Cerebral volume is normal.

There is no acute intracranial hemorrhage.

No demarcated cortical infarct.

No extra-axial fluid collection.

No evidence of an intracranial mass.

No midline shift.

Vascular: No hyperdense vessel.

Skull: Normal. Negative for fracture or focal lesion.

Sinuses: Mild to moderate mucosal thickening, and small mucous
retention cysts, within the right maxillary sinus.

Orbits: No orbital mass or acute orbital finding.

Other: Chronic fracture deformities of the bilateral nasal bones.

Review of the MIP images confirms the above findings

CTA NECK FINDINGS

Aortic arch: Standard aortic branching. Streak and beam hardening
artifact arising from a dense right-sided contrast bolus partially
obscures the right subclavian artery. Within this limitation, there
is no appreciable hemodynamically significant innominate or proximal
subclavian artery stenosis.

Right carotid system: CCA and ICA patent within the neck without
stenosis or significant atherosclerotic disease.

Left carotid system: CCA and ICA patent within the neck without
stenosis or significant atherosclerotic disease.

Vertebral arteries: Vertebral arteries patent within the neck
without stenosis or significant atherosclerotic disease. The right
vertebral artery is slightly dominant.

Skeleton: Cervical spondylosis. No acute bony abnormality or
aggressive osseous lesion.

Other neck: No neck mass or cervical lymphadenopathy.

Upper chest: No consolidation within the imaged lung apices.

Review of the MIP images confirms the above findings

CTA HEAD FINDINGS

Anterior circulation:

The intracranial internal carotid arteries are patent. The M1 middle
cerebral arteries are patent. No M2 proximal branch occlusion or
high-grade proximal stenosis is identified. The anterior cerebral
arteries are patent. No intracranial aneurysm is identified.

Posterior circulation:

The intracranial vertebral arteries are patent. The basilar artery
is patent. The posterior cerebral arteries are patent. Posterior
communicating arteries are present bilaterally.

Venous sinuses: Within the limitations of contrast timing, no
convincing thrombus.

Anatomic variants: None significant.

Review of the MIP images confirms the above findings
IMPRESSION: CT head:

1. No evidence of acute intracranial abnormality.
2. Right maxillary sinusitis.

CTA neck:

The common carotid, internal carotid and vertebral arteries are
patent within the neck without stenosis or significant
atherosclerotic disease.

CTA head:

No intracranial large vessel occlusion or proximal high-grade
arterial stenosis.

## 2023-10-29 ENCOUNTER — Encounter: Admitting: Physician Assistant
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
## 2023-11-08 ENCOUNTER — Encounter: Admitting: Physician Assistant

## 2023-11-08 DIAGNOSIS — H8102 Meniere's disease, left ear: Secondary | ICD-10-CM

## 2023-11-08 DIAGNOSIS — R5383 Other fatigue: Secondary | ICD-10-CM

## 2023-11-08 DIAGNOSIS — R350 Frequency of micturition: Secondary | ICD-10-CM

## 2023-11-08 DIAGNOSIS — Z87898 Personal history of other specified conditions: Secondary | ICD-10-CM

## 2023-11-08 DIAGNOSIS — R6882 Decreased libido: Secondary | ICD-10-CM

## 2023-11-08 DIAGNOSIS — E038 Other specified hypothyroidism: Secondary | ICD-10-CM

## 2023-11-24 ENCOUNTER — Other Ambulatory Visit: Payer: Self-pay | Admitting: Physician Assistant

## 2023-11-24 DIAGNOSIS — Z87898 Personal history of other specified conditions: Secondary | ICD-10-CM

## 2023-11-25 ENCOUNTER — Encounter: Payer: Self-pay | Admitting: Physician Assistant

## 2023-11-25 ENCOUNTER — Other Ambulatory Visit (HOSPITAL_COMMUNITY)
Admission: RE | Admit: 2023-11-25 | Discharge: 2023-11-25 | Disposition: A | Discharge status: Home / Self Care | Source: Ambulatory Visit | Attending: Physician Assistant | Admitting: Physician Assistant

## 2023-11-25 ENCOUNTER — Ambulatory Visit: Admitting: Physician Assistant

## 2023-11-25 VITALS — BP 129/80 | HR 77 | Resp 16 | Ht 69.0 in | Wt 186.2 lb

## 2023-11-25 DIAGNOSIS — R350 Frequency of micturition: Secondary | ICD-10-CM | POA: Insufficient documentation

## 2023-11-25 DIAGNOSIS — R300 Dysuria: Secondary | ICD-10-CM | POA: Insufficient documentation

## 2023-11-25 DIAGNOSIS — R399 Unspecified symptoms and signs involving the genitourinary system: Secondary | ICD-10-CM

## 2023-11-25 DIAGNOSIS — M5432 Sciatica, left side: Secondary | ICD-10-CM

## 2023-11-25 DIAGNOSIS — H8102 Meniere's disease, left ear: Secondary | ICD-10-CM

## 2023-11-25 DIAGNOSIS — E049 Nontoxic goiter, unspecified: Secondary | ICD-10-CM

## 2023-11-25 DIAGNOSIS — K219 Gastro-esophageal reflux disease without esophagitis: Secondary | ICD-10-CM

## 2023-11-25 DIAGNOSIS — J3089 Other allergic rhinitis: Secondary | ICD-10-CM

## 2023-11-25 DIAGNOSIS — R972 Elevated prostate specific antigen [PSA]: Secondary | ICD-10-CM

## 2023-11-25 DIAGNOSIS — Z1159 Encounter for screening for other viral diseases: Secondary | ICD-10-CM

## 2023-11-25 DIAGNOSIS — S36119S Unspecified injury of liver, sequela: Secondary | ICD-10-CM

## 2023-11-25 LAB — POCT URINALYSIS DIPSTICK
Bilirubin, UA: NEGATIVE
Blood, UA: NEGATIVE
Glucose, UA: NEGATIVE
Ketones, UA: NEGATIVE
Leukocytes, UA: NEGATIVE
Nitrite, UA: NEGATIVE
Protein, UA: NEGATIVE
Spec Grav, UA: 1.02 (ref 1.010–1.025)
Urobilinogen, UA: 0.2 U/dL
pH, UA: 6 (ref 5.0–8.0)

## 2023-11-25 MED ORDER — MECLIZINE HCL 25 MG PO TABS: 25.0000 mg | ORAL_TABLET | Freq: Three times a day (TID) | ORAL | 0 refills | Status: AC | PRN

## 2023-11-25 MED ORDER — OMEPRAZOLE 20 MG PO CPDR: DELAYED_RELEASE_CAPSULE | ORAL | 2 refills | Status: AC

## 2023-11-25 NOTE — Progress Notes (Signed)
 Established patient visit  Patient: Roger Martin   DOB: 1965/07/03   58 y.o. Male  MRN: [id number] Visit Date: 11/25/2023  Today's healthcare provider: Jolynn Spencer, PA-C   Chief Complaint  Patient presents with   Dizziness    Pt states always jhad taken 25mg  of meclizine  and would like to discuss on going up. Had a dizzy spell last week and had to take 2 medication   Subjective     HPI     Dizziness    Additional comments: Pt states always jhad taken 25mg  of meclizine  and would like to discuss on going up. Had a dizzy spell last week and had to take 2 medication      Last edited by Wilfred Hargis RAMAN, CMA on 11/25/2023  8:47 AM.       Discussed the use of AI scribe software for clinical note transcription with the patient, who gave verbal consent to proceed.  History of Present Illness Roger Martin is a 58 year old male who presents with urinary frequency and difficulty urinating.  He experiences a constant need to urinate, a feeling of fullness, and difficulty initiating urination, especially during long drives. Urination is slow with occasional tingling and slight burning. He feels an urgent need to urinate once the sensation arises. These symptoms have persisted for two weeks.  He has a history of elevated PSA levels, with the last level recorded at 3.2 two years ago. He is unsure about previous urologist consultations but recalls a normal colonoscopy five years ago.  He denies current sexual activity due to separation and divorce, with no relations for a year. He is open to STD testing.  He takes omeprazole  for acid reflux every two to three days and meclizine  for Meniere's disease as needed, using three 12 mg pills for relief during vertigo attacks.       09/09/2023    3:25 PM 11/13/2021   10:16 AM 01/07/2021    1:32 PM  Depression screen PHQ 2/9  Decreased Interest 0 0 0  Down, Depressed, Hopeless 1 0 0  PHQ - 2 Score 1 0 0  Altered sleeping 0 0 0  Tired,  decreased energy 1 2 1   Change in appetite 0 0 0  Feeling bad or failure about yourself  1 0 0  Trouble concentrating 1 1 1   Moving slowly or fidgety/restless 0 0 0  Suicidal thoughts 0 0 0  PHQ-9 Score 4 3 2   Difficult doing work/chores   Somewhat difficult      09/09/2023    3:26 PM 11/13/2021   10:16 AM 01/07/2021    1:32 PM 12/17/2020    8:23 AM  GAD 7 : Generalized Anxiety Score  Nervous, Anxious, on Edge 0 0 0 0  Control/stop worrying 0 0 0 0  Worry too much - different things 1 0 0 0  Trouble relaxing 0 0 0 0  Restless 0 0 0 0  Easily annoyed or irritable 0 1 0 0  Afraid - awful might happen 0 0 0 0  Total GAD 7 Score 1 1 0 0  Anxiety Difficulty Somewhat difficult   Not difficult at all    Medications: Outpatient Medications Prior to Visit  Medication Sig   fexofenadine (ALLEGRA) 180 MG tablet Take 180 mg by mouth daily.   ondansetron  (ZOFRAN  ODT) 4 MG disintegrating tablet Take 1 tablet (4 mg total) by mouth every 8 (eight) hours as needed for nausea or vomiting.   Oxymetazoline  HCl (NASAL SPRAY NA) Place 1 spray into the nose daily.   [DISCONTINUED] meclizine  (ANTIVERT ) 12.5 MG tablet Take 1 tablet (12.5 mg total) by mouth 3 (three) times daily as needed for dizziness.   [DISCONTINUED] methylphenidate  (RITALIN ) 20 MG tablet Take 1 tablet (20 mg total) by mouth daily. (Patient not taking: Reported on 11/25/2023)   [DISCONTINUED] omeprazole  (PRILOSEC) 20 MG capsule TAKE 1 CAPSULE(20 MG) BY MOUTH DAILY   [DISCONTINUED] sildenafil (REVATIO) 20 MG tablet SMARTSIG:1-5 Tablet(s) By Mouth PRN (Patient not taking: Reported on 09/09/2023)   No facility-administered medications prior to visit.    Review of Systems All negative Except see HPI       Objective    BP 129/80   Pulse 77   Resp 16   Ht 5' 9 (1.753 m)   Wt 186 lb 3.2 oz (84.5 kg)   SpO2 100%   BMI 27.50 kg/m     Physical Exam Vitals reviewed.  Constitutional:      General: He is not in acute  distress.    Appearance: Normal appearance. He is not diaphoretic.  HENT:     Head: Normocephalic and atraumatic.  Eyes:     General: No scleral icterus.    Conjunctiva/sclera: Conjunctivae normal.  Cardiovascular:     Rate and Rhythm: Normal rate and regular rhythm.     Pulses: Normal pulses.     Heart sounds: Normal heart sounds. No murmur heard. Pulmonary:     Effort: Pulmonary effort is normal. No respiratory distress.     Breath sounds: Normal breath sounds. No wheezing or rhonchi.  Musculoskeletal:     Cervical back: Neck supple.     Right lower leg: No edema.     Left lower leg: No edema.  Lymphadenopathy:     Cervical: No cervical adenopathy.  Skin:    General: Skin is warm and dry.     Findings: No rash.  Neurological:     Mental Status: He is alert and oriented to person, place, and time. Mental status is at baseline.  Psychiatric:        Mood and Affect: Mood normal.        Behavior: Behavior normal.      Results for orders placed or performed in visit on 11/25/23  POCT urinalysis dipstick  Result Value Ref Range   Color, UA amber    Clarity, UA clear    Glucose, UA Negative Negative   Bilirubin, UA neg    Ketones, UA neg    Spec Grav, UA 1.020 1.010 - 1.025   Blood, UA neg    pH, UA 6.0 5.0 - 8.0   Protein, UA Negative Negative   Urobilinogen, UA 0.2 0.2 or 1.0 E.U./dL   Nitrite, UA neg    Leukocytes, UA Negative Negative   Appearance     Odor          Assessment & Plan Lower urinary tract symptoms due to benign prostatic hyperplasia Dysuria Increased urinary frequency Symptoms suggest benign prostatic hyperplasia. - Order PSA test. - Refer to urology. - Obtain urine sample for analysis. - Check for STDs. Will follow-up  Meniere's disease Chronic condition  intermittent vertigo managed with meclizine . Sodium intake monitored. - Prescribe meclizine  25 mg. - Refer to ENT. - Advise to monitor sodium intake and maintain hydration. Will  follow-up  Gastroesophageal reflux disease Chronic  symptoms managed with omeprazole  and lifestyle modifications. - Prescribe omeprazole . - Advise on lifestyle modifications. - Discussed avoiding caffeine, chocolate,  oranges, spicy foods, and heavy meals. Will follow-up  Allergic rhinitis, perennial and seasonal Managed with Allegra and nasal saline rinses. Flonase caused epistaxis. - Advise use of nasal saline gel. - Discussed potential need to switch antihistamines.  Sciatica Chronic  symptoms controlled with regular exercise. - Continue regular exercise regimen. Follow-up  Gastroesophageal reflux disease, unspecified whether esophagitis present  - omeprazole  (PRILOSEC) 20 MG capsule; TAKE 1 CAPSULE(20 MG) BY MOUTH DAILY  Dispense: 90 capsule; Refill: 2  Increased urinary frequency (Primary) - POCT urinalysis dipstick - Ambulatory referral to Urology - Urinalysis, Routine w reflex microscopic - Urine Culture - Urine cytology ancillary only  Lower urinary tract symptoms (LUTS) - PSA Total (Reflex To Free) - POCT urinalysis dipstick - Ambulatory referral to Urology  Dysuria - POCT urinalysis dipstick - Ambulatory referral to Urology - Urinalysis, Routine w reflex microscopic - Urine Culture - Urine cytology ancillary only  Meniere's disease of left ear - meclizine  (ANTIVERT ) 25 MG tablet; Take 1 tablet (25 mg total) by mouth 3 (three) times daily as needed for dizziness.  Dispense: 30 tablet; Refill: 0   Elevated PSA - PSA Total (Reflex To Free)  Hepatic trauma, sequela In the past, will recheck CMP  Seasonal allergic rhinitis due to other allergic trigger - Ambulatory referral to ENT  Goiter Found on physical exam TSH in the past was elevated Last TSH was within the normal limits - US  THYROID; Future Will follow-up  Need for hepatitis C screening test  - Hepatitis C antibody  Orders Placed This Encounter  Procedures   Urine Culture   US  THYROID     Standing Status:   Future    Expiration Date:   11/24/2024    Reason for Exam (SYMPTOM  OR DIAGNOSIS REQUIRED):   goiter    Preferred imaging location?:   [facility]   Hepatitis C antibody   PSA Total (Reflex To Free)    Release to patient:   Immediate   Urinalysis, Routine w reflex microscopic   Ambulatory referral to Urology    Referral Priority:   Routine    Referral Type:   Consultation    Referral Reason:   Specialty Services Required    Requested Specialty:   Urology    Number of Visits Requested:   1   Ambulatory referral to ENT    Referral Priority:   Routine    Referral Type:   Consultation    Referral Reason:   Specialty Services Required    Requested Specialty:   Otolaryngology    Number of Visits Requested:   1   POCT urinalysis dipstick    No follow-ups on file.   The patient was advised to call back or seek an in-person evaluation if the symptoms worsen or if the condition fails to improve as anticipated.  I discussed the assessment and treatment plan with the patient. The patient was provided an opportunity to ask questions and all were answered. The patient agreed with the plan and demonstrated an understanding of the instructions.  I, Ryheem Jay, PA-C have reviewed all documentation for this visit. The documentation on 11/25/2023  for the exam, diagnosis, procedures, and orders are all accurate and complete.  Jolynn Spencer, [facility], MMS [facility] [phone] (phone) [phone] (fax)  [facility]

## 2023-11-26 LAB — URINE CYTOLOGY ANCILLARY ONLY
Chlamydia: NEGATIVE
Comment: NEGATIVE
Comment: NEGATIVE
Comment: NORMAL
Neisseria Gonorrhea: NEGATIVE
Trichomonas: NEGATIVE

## 2023-11-26 LAB — URINALYSIS, ROUTINE W REFLEX MICROSCOPIC
Bilirubin, UA: NEGATIVE
Glucose, UA: NEGATIVE
Leukocytes,UA: NEGATIVE
Nitrite, UA: NEGATIVE
RBC, UA: NEGATIVE
Specific Gravity, UA: 1.025 (ref 1.005–1.030)
Urobilinogen, Ur: 0.2 mg/dL (ref 0.2–1.0)
pH, UA: 6 (ref 5.0–7.5)

## 2023-11-26 LAB — PSA TOTAL (REFLEX TO FREE): Prostate Specific Ag, Serum: 2.9 ng/mL (ref 0.0–4.0)

## 2023-11-26 LAB — HEPATITIS C ANTIBODY: Hep C Virus Ab: NONREACTIVE

## 2023-11-28 LAB — URINE CULTURE: Organism ID, Bacteria: NO GROWTH

## 2023-11-28 LAB — SPECIMEN STATUS REPORT

## 2023-11-29 ENCOUNTER — Ambulatory Visit: Payer: Self-pay | Admitting: Physician Assistant

## 2023-12-13 ENCOUNTER — Ambulatory Visit
Admission: RE | Admit: 2023-12-13 | Discharge: 2023-12-13 | Disposition: A | Discharge status: Home / Self Care | Source: Ambulatory Visit | Attending: Physician Assistant | Admitting: Physician Assistant

## 2023-12-13 DIAGNOSIS — E049 Nontoxic goiter, unspecified: Secondary | ICD-10-CM | POA: Insufficient documentation

## 2023-12-16 DIAGNOSIS — H5203 Hypermetropia, bilateral: Secondary | ICD-10-CM | POA: Diagnosis not present

## 2023-12-16 DIAGNOSIS — H2513 Age-related nuclear cataract, bilateral: Secondary | ICD-10-CM | POA: Diagnosis not present
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
## 2023-12-19 NOTE — Assessment & Plan Note (Deleted)
  Today we reviewed the physiology and common causes of male lower urinary tract symptoms (LUTS). Discussed potential etiologies including infectious, inflammatory, bladder-related, benign prostatic hyperplasia (BPH), and musculoskeletal/pelvic floor contributions. Reviewed the standard diagnostic workup (urinalysis, PVR, uroflow, prostate assessment, possible cystoscopy or imaging) and the spectrum of initial management strategies ranging from behavioral and lifestyle measures to pharmacologic therapy, with procedural options if indicated. All questions were addressed and the patient expressed understanding of the evaluation and treatment pathway.

## 2023-12-23 ENCOUNTER — Ambulatory Visit: Admitting: Urology

## 2023-12-23 DIAGNOSIS — R399 Unspecified symptoms and signs involving the genitourinary system: Secondary | ICD-10-CM

## 2024-01-05 DIAGNOSIS — L299 Pruritus, unspecified: Secondary | ICD-10-CM | POA: Diagnosis not present

## 2024-01-05 DIAGNOSIS — R202 Paresthesia of skin: Secondary | ICD-10-CM | POA: Diagnosis not present

## 2024-01-17 DIAGNOSIS — J3081 Allergic rhinitis due to animal (cat) (dog) hair and dander: Secondary | ICD-10-CM | POA: Diagnosis not present

## 2024-01-17 DIAGNOSIS — H1045 Other chronic allergic conjunctivitis: Secondary | ICD-10-CM | POA: Diagnosis not present

## 2024-01-17 DIAGNOSIS — J301 Allergic rhinitis due to pollen: Secondary | ICD-10-CM | POA: Diagnosis not present

## 2024-01-17 DIAGNOSIS — J3089 Other allergic rhinitis: Secondary | ICD-10-CM | POA: Diagnosis not present

## 2024-01-25 ENCOUNTER — Ambulatory Visit: Admitting: Urology

## 2024-02-08 DIAGNOSIS — J301 Allergic rhinitis due to pollen: Secondary | ICD-10-CM | POA: Diagnosis not present

## 2024-02-09 DIAGNOSIS — J3089 Other allergic rhinitis: Secondary | ICD-10-CM | POA: Diagnosis not present

## 2024-02-20 NOTE — Progress Notes (Deleted)
 " Established patient visit  Patient: Roger Martin   DOB: [birth year]   58 y.o. Male  MRN: [id number] Visit Date: 02/21/2024  Today's healthcare provider: Jolynn Spencer, PA-C   No chief complaint on file.  Subjective       Discussed the use of AI scribe software for clinical note transcription with the patient, who gave verbal consent to proceed.  History of Present Illness  Except decrease vitamin D  levels. Advise over-the-counter vitamin D3 up to 2000 units daily       09/09/2023    3:25 PM 11/13/2021   10:16 AM 01/07/2021    1:32 PM  Depression screen PHQ 2/9  Decreased Interest 0 0 0  Down, Depressed, Hopeless 1 0 0  PHQ - 2 Score 1 0 0  Altered sleeping 0 0 0  Tired, decreased energy 1 2 1   Change in appetite 0 0 0  Feeling bad or failure about yourself  1 0 0  Trouble concentrating 1 1 1   Moving slowly or fidgety/restless 0 0 0  Suicidal thoughts 0 0 0  PHQ-9 Score 4  3  2    Difficult doing work/chores   Somewhat difficult     Data saved with a previous flowsheet row definition      09/09/2023    3:26 PM 11/13/2021   10:16 AM 01/07/2021    1:32 PM 12/17/2020    8:23 AM  GAD 7 : Generalized Anxiety Score  Nervous, Anxious, on Edge 0 0 0 0  Control/stop worrying 0 0 0 0  Worry too much - different things 1 0 0 0  Trouble relaxing 0 0 0 0  Restless 0 0 0 0  Easily annoyed or irritable 0 1 0 0  Afraid - awful might happen 0 0 0 0  Total GAD 7 Score 1 1 0 0  Anxiety Difficulty Somewhat difficult   Not difficult at all    Medications: Show/hide medication list[1]  Review of Systems  All other systems reviewed and are negative.  All negative Except see HPI   {Insert previous labs (optional):23779} {See past labs  Heme  Chem  Endocrine  Serology  Results Review (optional):1}   Objective    There were no vitals taken for this visit. {Insert last BP/Wt (optional):23777}{See vitals history (optional):1}   Physical Exam Vitals reviewed.   Constitutional:      General: He is not in acute distress.    Appearance: Normal appearance. He is not diaphoretic.  HENT:     Head: Normocephalic and atraumatic.  Eyes:     General: No scleral icterus.    Conjunctiva/sclera: Conjunctivae normal.  Cardiovascular:     Rate and Rhythm: Normal rate and regular rhythm.     Pulses: Normal pulses.     Heart sounds: Normal heart sounds. No murmur heard. Pulmonary:     Effort: Pulmonary effort is normal. No respiratory distress.     Breath sounds: Normal breath sounds. No wheezing or rhonchi.  Musculoskeletal:     Cervical back: Neck supple.     Right lower leg: No edema.     Left lower leg: No edema.  Lymphadenopathy:     Cervical: No cervical adenopathy.  Skin:    General: Skin is warm and dry.     Findings: No rash.  Neurological:     Mental Status: He is alert and oriented to person, place, and time. Mental status is at baseline.  Psychiatric:  Mood and Affect: Mood normal.        Behavior: Behavior normal.     No results found for any visits on 02/21/24.      Assessment and Plan Assessment & Plan     No orders of the defined types were placed in this encounter.   No follow-ups on file.   The patient was advised to call back or seek an in-person evaluation if the symptoms worsen or if the condition fails to improve as anticipated.  I discussed the assessment and treatment plan with the patient. The patient was provided an opportunity to ask questions and all were answered. The patient agreed with the plan and demonstrated an understanding of the instructions.  I, Rylin Seavey, PA-C have reviewed all documentation for this visit. The documentation on 02/21/2024  for the exam, diagnosis, procedures, and orders are all accurate and complete.  Jolynn Spencer, [facility], MMS [facility] [phone] (phone) [phone] (fax)  [facility]     [1] Outpatient Medications Prior to Visit   Medication Sig   fexofenadine (ALLEGRA) 180 MG tablet Take 180 mg by mouth daily.   meclizine  (ANTIVERT ) 25 MG tablet Take 1 tablet (25 mg total) by mouth 3 (three) times daily as needed for dizziness.   omeprazole  (PRILOSEC) 20 MG capsule TAKE 1 CAPSULE(20 MG) BY MOUTH DAILY   ondansetron  (ZOFRAN  ODT) 4 MG disintegrating tablet Take 1 tablet (4 mg total) by mouth every 8 (eight) hours as needed for nausea or vomiting.   Oxymetazoline HCl (NASAL SPRAY NA) Place 1 spray into the nose daily.   No facility-administered medications prior to visit.  "

## 2024-02-21 ENCOUNTER — Ambulatory Visit: Admitting: Physician Assistant

## 2024-02-21 DIAGNOSIS — R300 Dysuria: Secondary | ICD-10-CM

## 2024-02-21 DIAGNOSIS — M5432 Sciatica, left side: Secondary | ICD-10-CM

## 2024-02-21 DIAGNOSIS — E049 Nontoxic goiter, unspecified: Secondary | ICD-10-CM

## 2024-02-21 DIAGNOSIS — E038 Other specified hypothyroidism: Secondary | ICD-10-CM

## 2024-02-21 DIAGNOSIS — R399 Unspecified symptoms and signs involving the genitourinary system: Secondary | ICD-10-CM

## 2024-02-21 DIAGNOSIS — H8102 Meniere's disease, left ear: Secondary | ICD-10-CM

## 2024-02-21 DIAGNOSIS — R972 Elevated prostate specific antigen [PSA]: Secondary | ICD-10-CM

## 2024-02-21 DIAGNOSIS — K219 Gastro-esophageal reflux disease without esophagitis: Secondary | ICD-10-CM

## 2024-02-21 DIAGNOSIS — J3089 Other allergic rhinitis: Secondary | ICD-10-CM

## 2024-02-28 ENCOUNTER — Ambulatory Visit: Admitting: Urology

## 2024-02-29 ENCOUNTER — Ambulatory Visit: Admitting: Urology

## 2024-02-29 VITALS — BP 135/93 | HR 86 | Ht 69.0 in | Wt 187.0 lb

## 2024-02-29 DIAGNOSIS — R399 Unspecified symptoms and signs involving the genitourinary system: Secondary | ICD-10-CM

## 2024-02-29 DIAGNOSIS — R350 Frequency of micturition: Secondary | ICD-10-CM

## 2024-02-29 DIAGNOSIS — N529 Male erectile dysfunction, unspecified: Secondary | ICD-10-CM | POA: Diagnosis not present

## 2024-02-29 DIAGNOSIS — R8281 Pyuria: Secondary | ICD-10-CM | POA: Diagnosis not present

## 2024-02-29 DIAGNOSIS — N4889 Other specified disorders of penis: Secondary | ICD-10-CM | POA: Diagnosis not present

## 2024-02-29 DIAGNOSIS — N401 Enlarged prostate with lower urinary tract symptoms: Secondary | ICD-10-CM | POA: Diagnosis not present

## 2024-02-29 LAB — BLADDER SCAN AMB NON-IMAGING
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
## 2024-03-01 ENCOUNTER — Encounter: Payer: Self-pay | Admitting: Urology

## 2024-03-01 LAB — URINALYSIS, COMPLETE
Bilirubin, UA: NEGATIVE
Glucose, UA: NEGATIVE
Ketones, UA: NEGATIVE
Nitrite, UA: NEGATIVE
RBC, UA: NEGATIVE
Specific Gravity, UA: 1.025 (ref 1.005–1.030)
Urobilinogen, Ur: 0.2 mg/dL (ref 0.2–1.0)
pH, UA: 6.5 (ref 5.0–7.5)

## 2024-03-01 LAB — MICROSCOPIC EXAMINATION

## 2024-03-04 LAB — CULTURE, URINE COMPREHENSIVE

## 2024-03-05 ENCOUNTER — Ambulatory Visit: Payer: Self-pay | Admitting: Urology

## 2024-03-13 ENCOUNTER — Other Ambulatory Visit: Payer: Self-pay | Admitting: *Deleted

## 2024-03-13 ENCOUNTER — Other Ambulatory Visit (HOSPITAL_BASED_OUTPATIENT_CLINIC_OR_DEPARTMENT_OTHER): Payer: Self-pay

## 2024-03-13 MED ORDER — TAMSULOSIN HCL 0.4 MG PO CAPS
0.4000 mg | ORAL_CAPSULE | Freq: Every day | ORAL | 1 refills | Status: AC
  Filled 2024-03-13: qty 30, 30d supply, fill #0

## 2024-03-23 ENCOUNTER — Other Ambulatory Visit: Payer: Self-pay

## 2024-03-23 DIAGNOSIS — N401 Enlarged prostate with lower urinary tract symptoms: Secondary | ICD-10-CM

## 2024-03-30 ENCOUNTER — Other Ambulatory Visit (HOSPITAL_BASED_OUTPATIENT_CLINIC_OR_DEPARTMENT_OTHER): Payer: Self-pay

## 2024-04-06 ENCOUNTER — Other Ambulatory Visit
